# Patient Record
Sex: Female | Born: 1935
Health system: Southern US, Community
[De-identification: ages and names within clinical notes are randomized; demographics above are authoritative.]

## PROBLEM LIST (undated history)

## (undated) DIAGNOSIS — I10 Essential (primary) hypertension: Secondary | ICD-10-CM

## (undated) HISTORY — PX: ABDOMINAL HYSTERECTOMY: SHX81

---

## 2003-12-13 ENCOUNTER — Emergency Department (HOSPITAL_COMMUNITY): Admission: EM | Admit: 2003-12-13 | Discharge: 2003-12-13 | Payer: Self-pay | Admitting: *Deleted

## 2004-10-26 ENCOUNTER — Ambulatory Visit (HOSPITAL_COMMUNITY): Admission: RE | Admit: 2004-10-26 | Discharge: 2004-10-26 | Payer: Self-pay | Admitting: Internal Medicine

## 2014-06-06 ENCOUNTER — Emergency Department (HOSPITAL_COMMUNITY)
Admission: EM | Admit: 2014-06-06 | Discharge: 2014-06-06 | Disposition: A | Discharge status: Home / Self Care | Payer: Medicare HMO | Attending: Emergency Medicine | Admitting: Emergency Medicine

## 2014-06-06 ENCOUNTER — Emergency Department (HOSPITAL_COMMUNITY): Payer: Medicare HMO

## 2014-06-06 ENCOUNTER — Encounter (HOSPITAL_COMMUNITY): Payer: Self-pay | Admitting: Emergency Medicine

## 2014-06-06 DIAGNOSIS — M79609 Pain in unspecified limb: Secondary | ICD-10-CM | POA: Diagnosis present

## 2014-06-06 DIAGNOSIS — Z79899 Other long term (current) drug therapy: Secondary | ICD-10-CM | POA: Insufficient documentation

## 2014-06-06 DIAGNOSIS — R209 Unspecified disturbances of skin sensation: Secondary | ICD-10-CM | POA: Diagnosis not present

## 2014-06-06 DIAGNOSIS — Z7982 Long term (current) use of aspirin: Secondary | ICD-10-CM | POA: Diagnosis not present

## 2014-06-06 DIAGNOSIS — I10 Essential (primary) hypertension: Secondary | ICD-10-CM | POA: Diagnosis not present

## 2014-06-06 DIAGNOSIS — M1712 Unilateral primary osteoarthritis, left knee: Secondary | ICD-10-CM

## 2014-06-06 DIAGNOSIS — M1710 Unilateral primary osteoarthritis, unspecified knee: Secondary | ICD-10-CM | POA: Diagnosis not present

## 2014-06-06 HISTORY — DX: Essential (primary) hypertension: I10

## 2014-06-06 LAB — URINE MICROSCOPIC-ADD ON

## 2014-06-06 LAB — URINALYSIS, ROUTINE W REFLEX MICROSCOPIC
Bilirubin Urine: NEGATIVE
Glucose, UA: NEGATIVE mg/dL
Ketones, ur: NEGATIVE mg/dL
LEUKOCYTES UA: NEGATIVE
NITRITE: NEGATIVE
PH: 7.5 (ref 5.0–8.0)
Protein, ur: NEGATIVE mg/dL
SPECIFIC GRAVITY, URINE: 1.01 (ref 1.005–1.030)
UROBILINOGEN UA: 0.2 mg/dL (ref 0.0–1.0)

## 2014-06-06 MED ORDER — TRAMADOL HCL 50 MG PO TABS: 50.0000 mg | ORAL_TABLET | Freq: Four times a day (QID) | ORAL | Status: AC | PRN

## 2014-06-06 NOTE — ED Notes (Signed)
Patient with no complaints at this time. Respirations even and unlabored. Skin warm/dry. Discharge instructions reviewed with patient at this time. Patient given opportunity to voice concerns/ask questions. Patient discharged at this time and left Emergency Department with steady gait.   

## 2014-06-06 NOTE — ED Provider Notes (Signed)
CSN: [id number]     Arrival date & time 06/06/14  0930 History   First MD Initiated Contact with Patient 06/06/14 1013     Chief Complaint  Patient presents with  . Leg Pain     (Consider location/radiation/quality/duration/timing/severity/associated sxs/prior Treatment) The history is provided by the patient and a relative.   Jamie Green is a 80 y.o. female presenting with a one-week history of left leg pain along with intermittent episodes of numbness in her foot.  She denies prior episodes of similar symptoms and denies injuries, no history of low back pain or injury.  Her pain localizes behind her knee but radiates laterally into her calf.  She was seen by her PCP 2 days ago at which time she was placed on gabapentin and also advised to start taking a baby aspirin which she has done, no improvement in symptoms yet.  Her pain is constant and aching and worse with weightbearing.  She denies swelling, redness, rash.  Additionally she denies fevers or chills, abdominal pain or distention, shortness of breath or cough.    Past Medical History  Diagnosis Date  . Hypertension    Past Surgical History  Procedure Laterality Date  . Abdominal hysterectomy     No family history on file. History  Substance Use Topics  . Smoking status: Never Smoker   . Smokeless tobacco: Not on file  . Alcohol Use: No   OB History   Grav Para Term Preterm Abortions TAB SAB Ect Mult Living                 Review of Systems  Constitutional: Negative for fever and activity change.  HENT: Negative for congestion and sore throat.   Eyes: Negative.   Respiratory: Negative for cough, chest tightness and shortness of breath.   Cardiovascular: Negative for chest pain.  Gastrointestinal: Negative for nausea and abdominal pain.  Genitourinary: Negative.   Musculoskeletal: Positive for arthralgias. Negative for joint swelling and neck pain.  Skin: Negative.  Negative for rash and wound.  Neurological:  Positive for numbness. Negative for dizziness, weakness, light-headedness and headaches.  Psychiatric/Behavioral: Negative.       Allergies  Review of patient's allergies indicates no known allergies.  Home Medications   Prior to Admission medications   Medication Sig Start Date End Date Taking? Authorizing Provider  aspirin EC 81 MG tablet Take 81 mg by mouth daily.   Yes Historical Provider, MD  atenolol-chlorthalidone (TENORETIC) 50-25 MG per tablet Take 1 tablet by mouth daily.   Yes Historical Provider, MD  gabapentin (NEURONTIN) 100 MG capsule Take 100 mg by mouth 3 (three) times daily.   Yes Historical Provider, MD  traMADol (ULTRAM) 50 MG tablet Take 1 tablet (50 mg total) by mouth every 6 (six) hours as needed. 06/06/14   Burgess Amor, PA-C   BP 141/60  Pulse 57  Temp(Src) 97.5 F (36.4 C) (Oral)  Resp 17  Ht  (1.676 m)  Wt 130 lb (58.968 kg)  BMI 20.99 kg/m2  SpO2 100% Physical Exam  Constitutional: She appears well-developed and well-nourished.  HENT:  Head: Atraumatic.  Neck: Normal range of motion.  Cardiovascular:  Pulses equal bilaterally  Musculoskeletal: She exhibits no tenderness.       Left knee: She exhibits swelling.  Localized nontender edema in her left popliteal space when standing suggesting possible Baker's cyst.  She has several patches of superficial spider veins lateral left knee.  Small soft nontender varicosity left lateral  calf.  Is soft, nontender, no peripheral edema.  Pedal pulses are intact.  Lumbar spine is nontender.  Negative Homans sign.  Pain is not reproducible with palpation.  Neurological: She is alert. She has normal strength. She displays normal reflexes. No sensory deficit.  Skin: Skin is warm and dry. No rash noted.  Psychiatric: She has a normal mood and affect.    ED Course  Procedures (including critical care time) Labs Review Labs Reviewed  URINALYSIS, ROUTINE W REFLEX MICROSCOPIC - Abnormal; Notable for the  following:    Hgb urine dipstick TRACE (*)    All other components within normal limits  URINE MICROSCOPIC-ADD ON    Imaging Review Dg Lumbar Spine Complete  06/06/2014   CLINICAL DATA:  80 year old female with low back pain.  EXAM: LUMBAR SPINE - COMPLETE 4+ VIEW  COMPARISON:  None.  FINDINGS: Five non rib-bearing lumbar type vertebra are identified identified in normal alignment.  There is no evidence of acute fracture or subluxation.  Mild to moderate degenerative disc disease and spondylosis throughout the lumbar spine noted.  Moderate facet arthropathy in the lower lumbar spine is noted.  No focal bony lesions or spondylolysis identified.  IMPRESSION: No evidence of acute abnormality.  Mild to moderate multilevel degenerative changes.   Electronically Signed   By: Laveda Abbe M.D.   On: 06/06/2014 12:15   Dg Knee Complete 4 Views Left  06/06/2014   CLINICAL DATA:  Leg pain history knee pain.  EXAM: LEFT KNEE - COMPLETE 4+ VIEW  COMPARISON:  None.  FINDINGS: Decreased bony mineralization. Normal alignment. No acute or healing fracture or focal bony lesion. No significant joint space narrowing. Enthesopathic changes on the superior pole of the patella at the quadriceps tendon insertion site. Negative for pleural effusion or focal soft tissue swelling.  IMPRESSION: No acute bony abnormality or significant degenerative change for patient age.  Decreased bony mineralization.   Electronically Signed   By: Britta Mccreedy M.D.   On: 06/06/2014 12:15     EKG Interpretation None      MDM   Final diagnoses:  Primary osteoarthritis of left knee    Towards the end of ED visit, family members expressed concern over patient's  forgetfulness which has been present for the past several years but seems to be becoming more frequent and noticeable.  For example, yesterday she thought it was Sunday when it was really Saturday which is unusual for her.  Family is reluctant to discuss this issue with the patient  present as this conversation occurred while she was in x-ray.  They have mentioned this concern to the patient's PCP but to their knowledge she has not had formal testing for dementia or Alzheimer's which family states runs in the family.  Advised that they should discuss this concern further with the patient's PCP, may also need to consider a neurology referral  for further testing.  Will check UA to rule out uti as common source of confusion in the elderly.  Patients labs and/or radiological studies were viewed and considered during the medical decision making and disposition process. Mild arthritis on xrays, suspect this as source of sx. Pt was encouraged to take the medicines prescribed by her pcp 2 days ago.  Will given script for small quantity of tramadol, advised re sedation effects.  Heat tx.  F/u with pcp for recheck and further eval if sx persist.  Pt was discussed with Dr. Juleen China during this visit who also saw pt.  Burgess Amor, PA-C 06/06/14 1723

## 2014-06-06 NOTE — Discharge Instructions (Signed)
Arthritis, Nonspecific °Arthritis is inflammation of a joint. This usually means pain, redness, warmth or swelling are present. One or more joints may be involved. There are a number of types of arthritis. Your caregiver may not be able to tell what type of arthritis you have right away. °CAUSES  °The most common cause of arthritis is the wear and tear on the joint (osteoarthritis). This causes damage to the cartilage, which can break down over time. The knees, hips, back and neck are most often affected by this type of arthritis. °Other types of arthritis and common causes of joint pain include: °· Sprains and other injuries near the joint. Sometimes minor sprains and injuries cause pain and swelling that develop hours later. °· Rheumatoid arthritis. This affects hands, feet and knees. It usually affects both sides of your body at the same time. It is often associated with chronic ailments, fever, weight loss and general weakness. °· Crystal arthritis. Gout and pseudo gout can cause occasional acute severe pain, redness and swelling in the foot, ankle, or knee. °· Infectious arthritis. Bacteria can get into a joint through a break in overlying skin. This can cause infection of the joint. Bacteria and viruses can also spread through the blood and affect your joints. °· Drug, infectious and allergy reactions. Sometimes joints can become mildly painful and slightly swollen with these types of illnesses. °SYMPTOMS  °· Pain is the main symptom. °· Your joint or joints can also be red, swollen and warm or hot to the touch. °· You may have a fever with certain types of arthritis, or even feel overall ill. °· The joint with arthritis will hurt with movement. Stiffness is present with some types of arthritis. °DIAGNOSIS  °Your caregiver will suspect arthritis based on your description of your symptoms and on your exam. Testing may be needed to find the type of arthritis: °· Blood and sometimes urine tests. °· X-ray tests  and sometimes CT or MRI scans. °· Removal of fluid from the joint (arthrocentesis) is done to check for bacteria, crystals or other causes. Your caregiver (or a specialist) will numb the area over the joint with a local anesthetic, and use a needle to remove joint fluid for examination. This procedure is only minimally uncomfortable. °· Even with these tests, your caregiver may not be able to tell what kind of arthritis you have. Consultation with a specialist (rheumatologist) may be helpful. °TREATMENT  °Your caregiver will discuss with you treatment specific to your type of arthritis. If the specific type cannot be determined, then the following general recommendations may apply. °Treatment of severe joint pain includes: °· Rest. °· Elevation. °· Anti-inflammatory medication (for example, ibuprofen) may be prescribed. Avoiding activities that cause increased pain. °· Only take over-the-counter or prescription medicines for pain and discomfort as recommended by your caregiver. °· Cold packs over an inflamed joint may be used for 10 to 15 minutes every hour. Hot packs sometimes feel better, but do not use overnight. Do not use hot packs if you are diabetic without your caregiver's permission. °· A cortisone shot into arthritic joints may help reduce pain and swelling. °· Any acute arthritis that gets worse over the next 1 to 2 days needs to be looked at to be sure there is no joint infection. °Long-term arthritis treatment involves modifying activities and lifestyle to reduce joint stress jarring. This can include weight loss. Also, exercise is needed to nourish the joint cartilage and remove waste. This helps keep the muscles   around the joint strong. HOME CARE INSTRUCTIONS   Do not take aspirin to relieve pain if gout is suspected. This elevates uric acid levels.  Only take over-the-counter or prescription medicines for pain, discomfort or fever as directed by your caregiver.  Rest the joint as much as  possible.  If your joint is swollen, keep it elevated.  Use crutches if the painful joint is in your leg.  Drinking plenty of fluids may help for certain types of arthritis.  Follow your caregiver's dietary instructions.  Try low-impact exercise such as:  Swimming.  Water aerobics.  Biking.  Walking.  Morning stiffness is often relieved by a warm shower.  Put your joints through regular range-of-motion. SEEK MEDICAL CARE IF:   You do not feel better in 24 hours or are getting worse.  You have side effects to medications, or are not getting better with treatment. SEEK IMMEDIATE MEDICAL CARE IF:   You have a fever.  You develop severe joint pain, swelling or redness.  Many joints are involved and become painful and swollen.  There is severe back pain and/or leg weakness.  You have loss of bowel or bladder control. Document Released: 10/18/2004 Document Revised: 12/03/2011 Document Reviewed: 11/03/2008 [facility] Patient Information 2015 [place], [place]. This information is not intended to replace advice given to you by your health care provider. Make sure you discuss any questions you have with your health care provider.   You may use the medicine prescribed if needed for additional pain relief.  This medicine will make you drowsy so use caution taking this medicine.

## 2014-06-06 NOTE — ED Notes (Signed)
Pt states she has been having leg pain on the left side for about a week. Pain has been increasing since this weekend. Pt reports pain with ambulation. Pt has been taking daily medications as prescribes. Pt states she has not been taking any OTC medications.

## 2014-06-09 NOTE — ED Provider Notes (Signed)
Medical screening examination/treatment/procedure(s) were performed by non-physician practitioner and as supervising physician I was immediately available for consultation/collaboration.   EKG Interpretation None       Shelby Anderle, MD 06/09/14 1235 

## 2014-06-29 ENCOUNTER — Ambulatory Visit: Payer: Medicare HMO | Admitting: Orthopedic Surgery

## 2014-07-12 ENCOUNTER — Encounter: Payer: Medicare HMO | Admitting: Vascular Surgery

## 2014-07-12 ENCOUNTER — Encounter (HOSPITAL_COMMUNITY): Payer: Medicare HMO

## 2014-07-16 ENCOUNTER — Other Ambulatory Visit: Payer: Self-pay

## 2014-07-16 ENCOUNTER — Encounter: Payer: Self-pay | Admitting: Vascular Surgery

## 2014-07-16 DIAGNOSIS — M79605 Pain in left leg: Secondary | ICD-10-CM

## 2014-07-16 DIAGNOSIS — R202 Paresthesia of skin: Secondary | ICD-10-CM

## 2014-08-02 ENCOUNTER — Encounter: Payer: Self-pay | Admitting: Vascular Surgery

## 2014-08-03 ENCOUNTER — Inpatient Hospital Stay (HOSPITAL_COMMUNITY): Admission: RE | Admit: 2014-08-03 | Payer: Medicare HMO | Source: Ambulatory Visit

## 2014-08-03 ENCOUNTER — Encounter: Payer: Medicare HMO | Admitting: Vascular Surgery

## 2014-08-03 ENCOUNTER — Encounter (HOSPITAL_COMMUNITY): Payer: Medicare HMO

## 2014-11-01 ENCOUNTER — Ambulatory Visit: Payer: Medicare HMO | Admitting: Internal Medicine

## 2015-06-30 DIAGNOSIS — I83812 Varicose veins of left lower extremities with pain: Secondary | ICD-10-CM | POA: Diagnosis not present

## 2015-08-19 DIAGNOSIS — I10 Essential (primary) hypertension: Secondary | ICD-10-CM | POA: Diagnosis not present

## 2015-08-19 DIAGNOSIS — Z6821 Body mass index (BMI) 21.0-21.9, adult: Secondary | ICD-10-CM | POA: Diagnosis not present

## 2015-08-19 DIAGNOSIS — N183 Chronic kidney disease, stage 3 (moderate): Secondary | ICD-10-CM | POA: Diagnosis not present

## 2015-09-09 IMAGING — CR DG KNEE COMPLETE 4+V*L*
4 series · 4 of 4 positions shown · non-contrast
Comparison: None.

CLINICAL DATA: Leg pain history knee pain.

EXAM:
LEFT KNEE - COMPLETE 4+ VIEW

[view not recorded (1 of 4)]
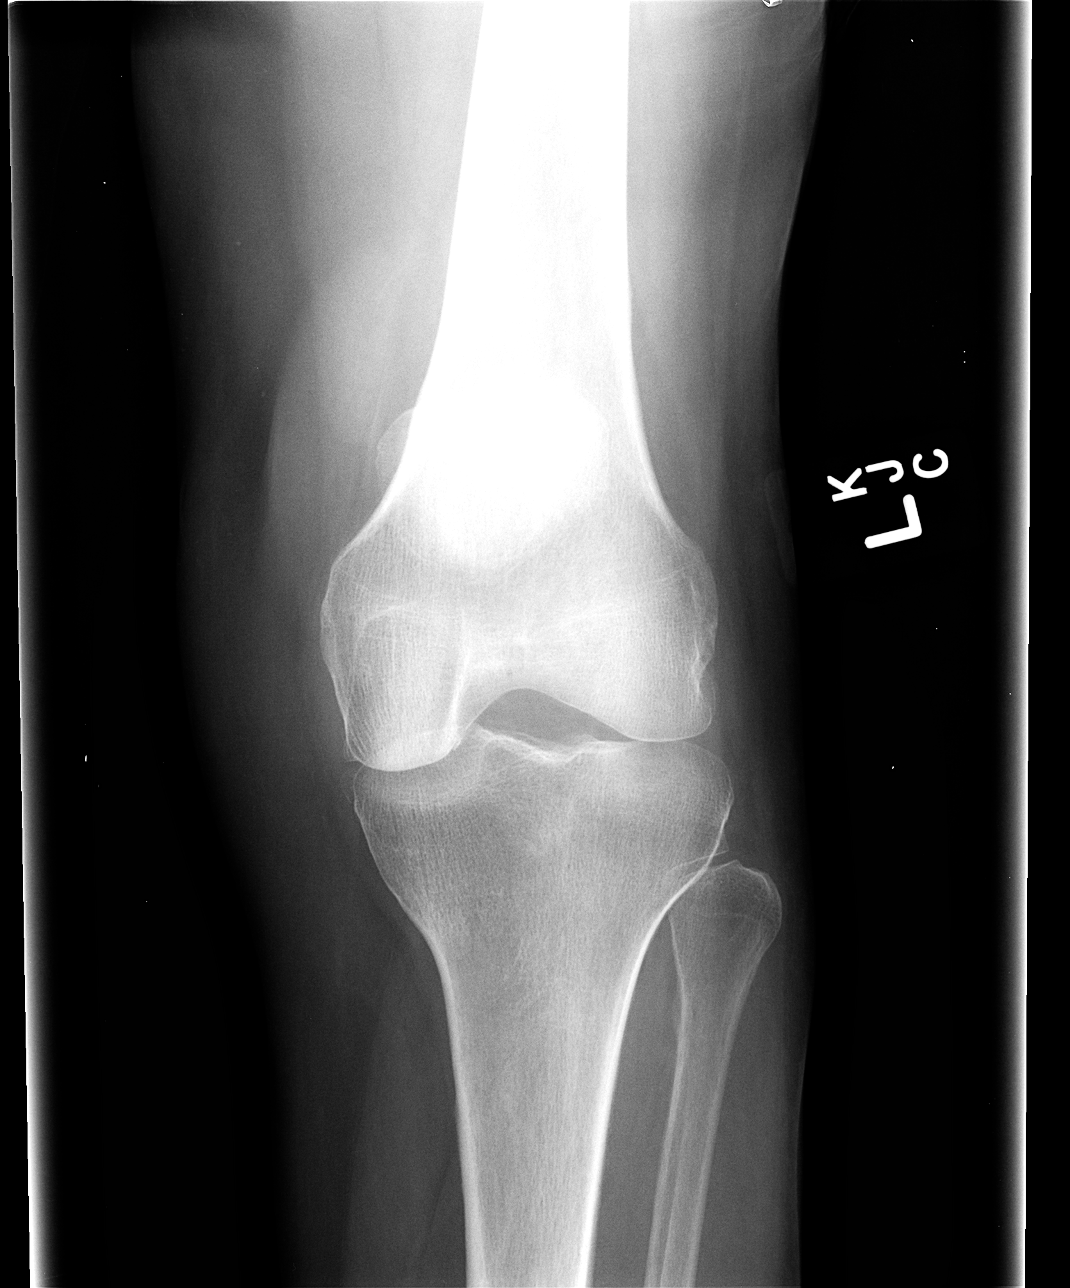

[view not recorded (2 of 4)]
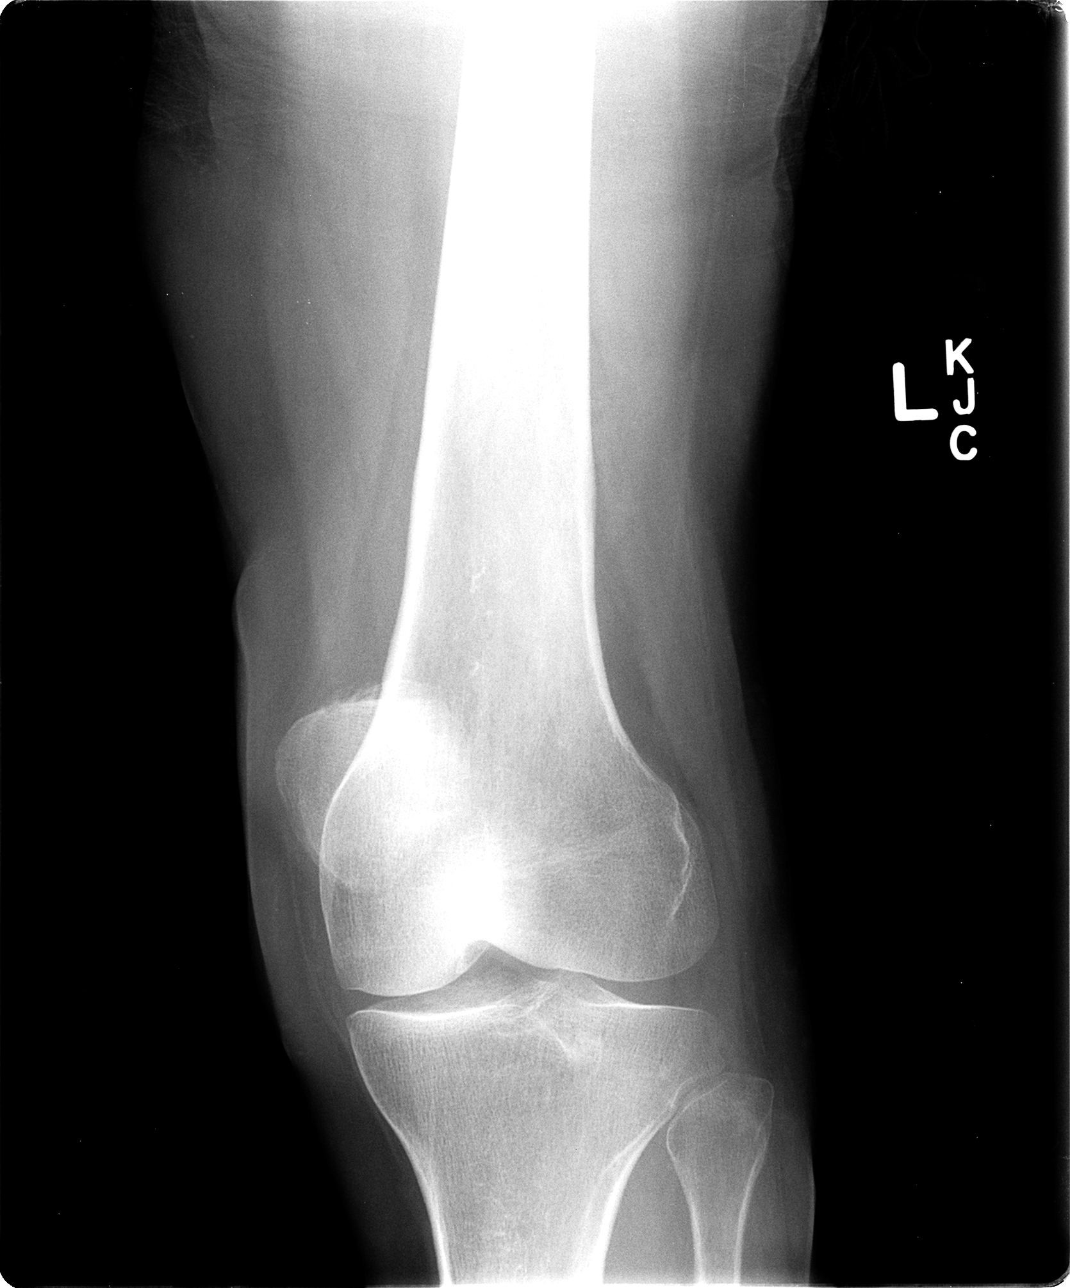

[view not recorded (3 of 4)]
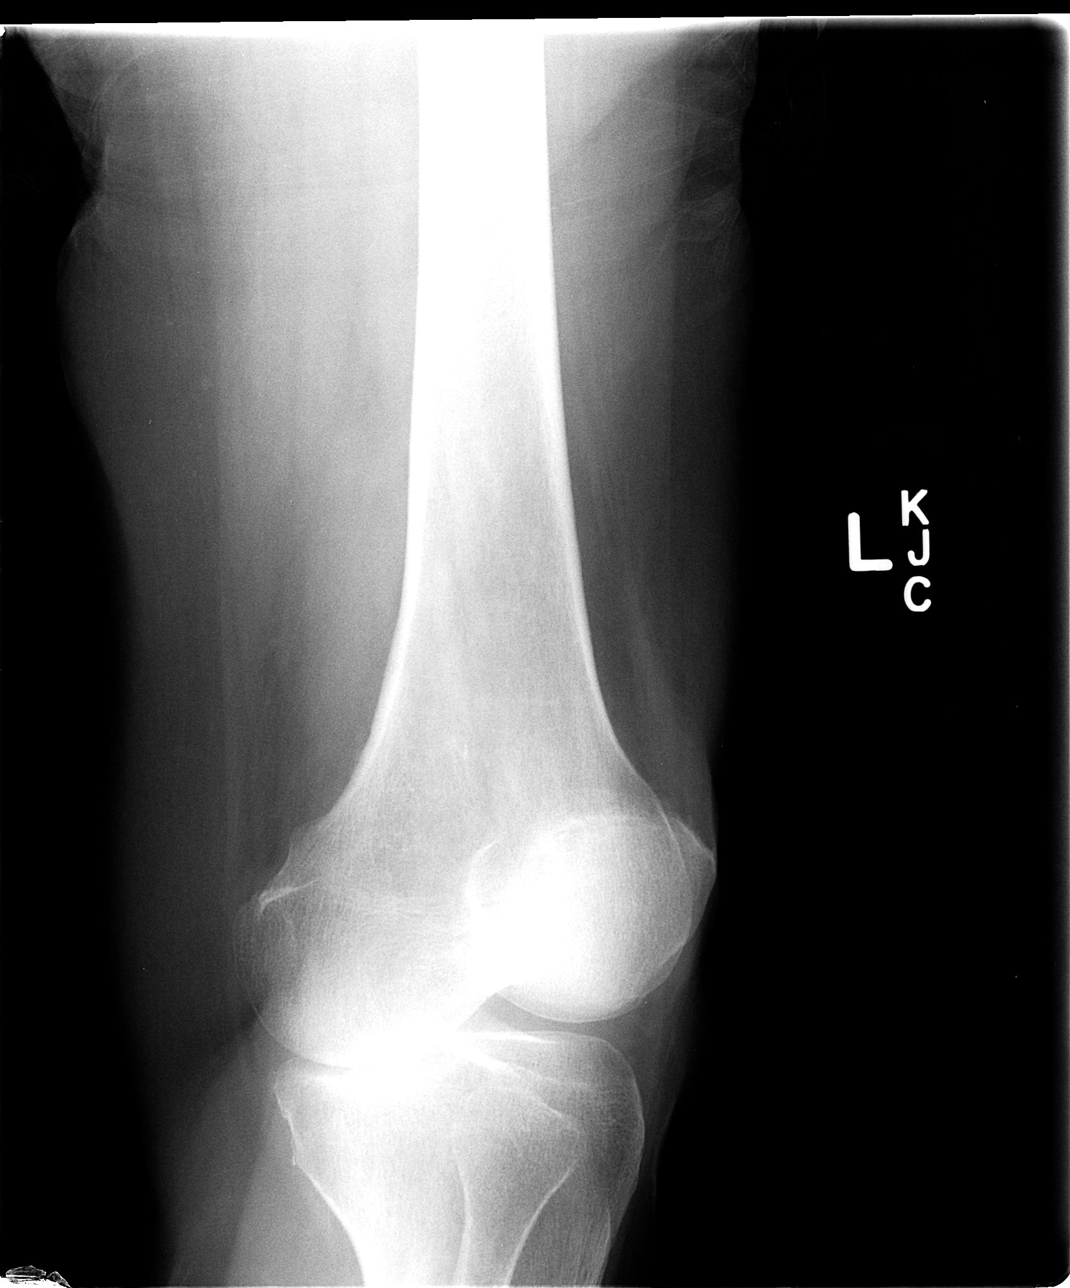

[view not recorded (4 of 4)]
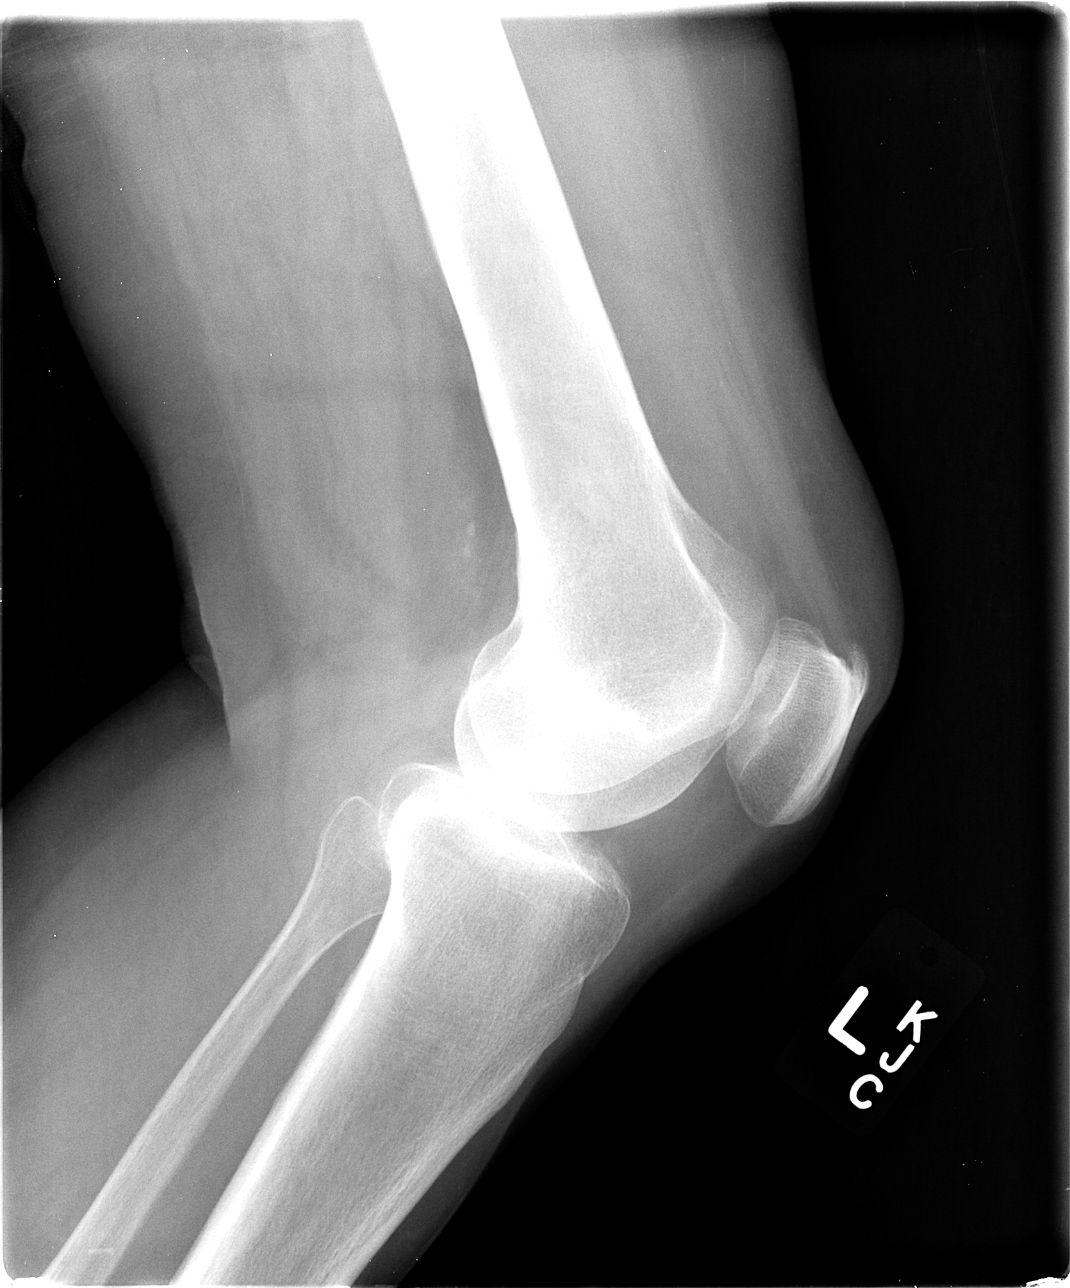

[4 of 4 positions shown; findings below may reference images not displayed]

FINDINGS: Decreased bony mineralization. Normal alignment. No acute or healing
fracture or focal bony lesion. No significant joint space narrowing.
Enthesopathic changes on the superior pole of the patella at the
quadriceps tendon insertion site. Negative for pleural effusion or
focal soft tissue swelling.
IMPRESSION: No acute bony abnormality or significant degenerative change for
patient age.

Decreased bony mineralization.

## 2015-11-14 DIAGNOSIS — I10 Essential (primary) hypertension: Secondary | ICD-10-CM | POA: Diagnosis not present

## 2015-11-14 DIAGNOSIS — Z6820 Body mass index (BMI) 20.0-20.9, adult: Secondary | ICD-10-CM | POA: Diagnosis not present

## 2015-11-14 DIAGNOSIS — N183 Chronic kidney disease, stage 3 (moderate): Secondary | ICD-10-CM | POA: Diagnosis not present

## 2015-11-14 DIAGNOSIS — N952 Postmenopausal atrophic vaginitis: Secondary | ICD-10-CM | POA: Diagnosis not present

## 2016-01-30 DIAGNOSIS — N183 Chronic kidney disease, stage 3 (moderate): Secondary | ICD-10-CM | POA: Diagnosis not present

## 2016-01-30 DIAGNOSIS — Z6821 Body mass index (BMI) 21.0-21.9, adult: Secondary | ICD-10-CM | POA: Diagnosis not present

## 2016-01-30 DIAGNOSIS — I160 Hypertensive urgency: Secondary | ICD-10-CM | POA: Diagnosis not present

## 2016-01-30 DIAGNOSIS — N3941 Urge incontinence: Secondary | ICD-10-CM | POA: Diagnosis not present

## 2016-01-30 DIAGNOSIS — I10 Essential (primary) hypertension: Secondary | ICD-10-CM | POA: Diagnosis not present

## 2016-04-04 DIAGNOSIS — M25562 Pain in left knee: Secondary | ICD-10-CM | POA: Diagnosis not present

## 2016-05-17 DIAGNOSIS — Z6821 Body mass index (BMI) 21.0-21.9, adult: Secondary | ICD-10-CM | POA: Diagnosis not present

## 2016-05-17 DIAGNOSIS — Z1389 Encounter for screening for other disorder: Secondary | ICD-10-CM | POA: Diagnosis not present

## 2016-05-17 DIAGNOSIS — Z0000 Encounter for general adult medical examination without abnormal findings: Secondary | ICD-10-CM | POA: Diagnosis not present

## 2016-05-17 DIAGNOSIS — I10 Essential (primary) hypertension: Secondary | ICD-10-CM | POA: Diagnosis not present

## 2016-06-21 DIAGNOSIS — Z1231 Encounter for screening mammogram for malignant neoplasm of breast: Secondary | ICD-10-CM | POA: Diagnosis not present

## 2016-08-20 DIAGNOSIS — I10 Essential (primary) hypertension: Secondary | ICD-10-CM | POA: Diagnosis not present

## 2016-08-20 DIAGNOSIS — Z6821 Body mass index (BMI) 21.0-21.9, adult: Secondary | ICD-10-CM | POA: Diagnosis not present

## 2016-08-20 DIAGNOSIS — E784 Other hyperlipidemia: Secondary | ICD-10-CM | POA: Diagnosis not present

## 2016-08-23 DIAGNOSIS — R69 Illness, unspecified: Secondary | ICD-10-CM | POA: Diagnosis not present

## 2016-12-17 DIAGNOSIS — Z6822 Body mass index (BMI) 22.0-22.9, adult: Secondary | ICD-10-CM | POA: Diagnosis not present

## 2016-12-17 DIAGNOSIS — I10 Essential (primary) hypertension: Secondary | ICD-10-CM | POA: Diagnosis not present

## 2016-12-17 DIAGNOSIS — Z0000 Encounter for general adult medical examination without abnormal findings: Secondary | ICD-10-CM | POA: Diagnosis not present

## 2017-03-11 DIAGNOSIS — E784 Other hyperlipidemia: Secondary | ICD-10-CM | POA: Diagnosis not present

## 2017-03-11 DIAGNOSIS — K5150 Left sided colitis without complications: Secondary | ICD-10-CM | POA: Diagnosis not present

## 2017-03-11 DIAGNOSIS — Z6821 Body mass index (BMI) 21.0-21.9, adult: Secondary | ICD-10-CM | POA: Diagnosis not present

## 2017-03-11 DIAGNOSIS — I10 Essential (primary) hypertension: Secondary | ICD-10-CM | POA: Diagnosis not present

## 2017-03-13 DIAGNOSIS — Z79899 Other long term (current) drug therapy: Secondary | ICD-10-CM | POA: Diagnosis not present

## 2017-03-13 DIAGNOSIS — I10 Essential (primary) hypertension: Secondary | ICD-10-CM | POA: Diagnosis not present

## 2017-03-13 DIAGNOSIS — E784 Other hyperlipidemia: Secondary | ICD-10-CM | POA: Diagnosis not present

## 2017-06-13 DIAGNOSIS — Z131 Encounter for screening for diabetes mellitus: Secondary | ICD-10-CM | POA: Diagnosis not present

## 2017-06-13 DIAGNOSIS — Z0000 Encounter for general adult medical examination without abnormal findings: Secondary | ICD-10-CM | POA: Diagnosis not present

## 2017-06-13 DIAGNOSIS — E784 Other hyperlipidemia: Secondary | ICD-10-CM | POA: Diagnosis not present

## 2017-06-13 DIAGNOSIS — I10 Essential (primary) hypertension: Secondary | ICD-10-CM | POA: Diagnosis not present

## 2017-06-13 DIAGNOSIS — Z6821 Body mass index (BMI) 21.0-21.9, adult: Secondary | ICD-10-CM | POA: Diagnosis not present

## 2017-06-13 DIAGNOSIS — Z1389 Encounter for screening for other disorder: Secondary | ICD-10-CM | POA: Diagnosis not present

## 2017-07-02 DIAGNOSIS — H35033 Hypertensive retinopathy, bilateral: Secondary | ICD-10-CM | POA: Diagnosis not present

## 2017-11-04 DIAGNOSIS — Z79899 Other long term (current) drug therapy: Secondary | ICD-10-CM | POA: Diagnosis not present

## 2017-11-04 DIAGNOSIS — G3184 Mild cognitive impairment, so stated: Secondary | ICD-10-CM | POA: Diagnosis not present

## 2017-11-04 DIAGNOSIS — I10 Essential (primary) hypertension: Secondary | ICD-10-CM | POA: Diagnosis not present

## 2017-11-04 DIAGNOSIS — Z6820 Body mass index (BMI) 20.0-20.9, adult: Secondary | ICD-10-CM | POA: Diagnosis not present

## 2017-11-04 DIAGNOSIS — E7849 Other hyperlipidemia: Secondary | ICD-10-CM | POA: Diagnosis not present

## 2017-11-04 DIAGNOSIS — Z1389 Encounter for screening for other disorder: Secondary | ICD-10-CM | POA: Diagnosis not present

## 2017-11-04 DIAGNOSIS — Z6821 Body mass index (BMI) 21.0-21.9, adult: Secondary | ICD-10-CM | POA: Diagnosis not present

## 2018-02-04 DIAGNOSIS — I10 Essential (primary) hypertension: Secondary | ICD-10-CM | POA: Diagnosis not present

## 2018-02-04 DIAGNOSIS — Z6820 Body mass index (BMI) 20.0-20.9, adult: Secondary | ICD-10-CM | POA: Diagnosis not present

## 2018-02-04 DIAGNOSIS — J0111 Acute recurrent frontal sinusitis: Secondary | ICD-10-CM | POA: Diagnosis not present

## 2018-04-14 DIAGNOSIS — R197 Diarrhea, unspecified: Secondary | ICD-10-CM | POA: Diagnosis not present

## 2018-05-07 DIAGNOSIS — E7849 Other hyperlipidemia: Secondary | ICD-10-CM | POA: Diagnosis not present

## 2018-05-07 DIAGNOSIS — Z6821 Body mass index (BMI) 21.0-21.9, adult: Secondary | ICD-10-CM | POA: Diagnosis not present

## 2018-05-07 DIAGNOSIS — N182 Chronic kidney disease, stage 2 (mild): Secondary | ICD-10-CM | POA: Diagnosis not present

## 2018-05-07 DIAGNOSIS — J0111 Acute recurrent frontal sinusitis: Secondary | ICD-10-CM | POA: Diagnosis not present

## 2018-05-07 DIAGNOSIS — I10 Essential (primary) hypertension: Secondary | ICD-10-CM | POA: Diagnosis not present

## 2018-05-07 DIAGNOSIS — Z6820 Body mass index (BMI) 20.0-20.9, adult: Secondary | ICD-10-CM | POA: Diagnosis not present

## 2018-07-23 DIAGNOSIS — N182 Chronic kidney disease, stage 2 (mild): Secondary | ICD-10-CM | POA: Diagnosis not present

## 2018-07-23 DIAGNOSIS — E7849 Other hyperlipidemia: Secondary | ICD-10-CM | POA: Diagnosis not present

## 2018-07-23 DIAGNOSIS — Z6821 Body mass index (BMI) 21.0-21.9, adult: Secondary | ICD-10-CM | POA: Diagnosis not present

## 2018-07-23 DIAGNOSIS — K210 Gastro-esophageal reflux disease with esophagitis: Secondary | ICD-10-CM | POA: Diagnosis not present

## 2018-07-23 DIAGNOSIS — M7061 Trochanteric bursitis, right hip: Secondary | ICD-10-CM | POA: Diagnosis not present

## 2018-07-23 DIAGNOSIS — I10 Essential (primary) hypertension: Secondary | ICD-10-CM | POA: Diagnosis not present

## 2018-11-20 DIAGNOSIS — Z6820 Body mass index (BMI) 20.0-20.9, adult: Secondary | ICD-10-CM | POA: Diagnosis not present

## 2018-11-20 DIAGNOSIS — N182 Chronic kidney disease, stage 2 (mild): Secondary | ICD-10-CM | POA: Diagnosis not present

## 2018-11-20 DIAGNOSIS — Z1389 Encounter for screening for other disorder: Secondary | ICD-10-CM | POA: Diagnosis not present

## 2018-11-20 DIAGNOSIS — E785 Hyperlipidemia, unspecified: Secondary | ICD-10-CM | POA: Diagnosis not present

## 2018-11-20 DIAGNOSIS — E7849 Other hyperlipidemia: Secondary | ICD-10-CM | POA: Diagnosis not present

## 2018-11-20 DIAGNOSIS — K210 Gastro-esophageal reflux disease with esophagitis: Secondary | ICD-10-CM | POA: Diagnosis not present

## 2018-11-20 DIAGNOSIS — Z0000 Encounter for general adult medical examination without abnormal findings: Secondary | ICD-10-CM | POA: Diagnosis not present

## 2018-11-20 DIAGNOSIS — I10 Essential (primary) hypertension: Secondary | ICD-10-CM | POA: Diagnosis not present

## 2018-12-18 DIAGNOSIS — Z6820 Body mass index (BMI) 20.0-20.9, adult: Secondary | ICD-10-CM | POA: Diagnosis not present

## 2018-12-18 DIAGNOSIS — H6121 Impacted cerumen, right ear: Secondary | ICD-10-CM | POA: Diagnosis not present

## 2019-02-24 DIAGNOSIS — K210 Gastro-esophageal reflux disease with esophagitis: Secondary | ICD-10-CM | POA: Diagnosis not present

## 2019-02-24 DIAGNOSIS — Z6820 Body mass index (BMI) 20.0-20.9, adult: Secondary | ICD-10-CM | POA: Diagnosis not present

## 2019-02-24 DIAGNOSIS — E785 Hyperlipidemia, unspecified: Secondary | ICD-10-CM | POA: Diagnosis not present

## 2019-02-24 DIAGNOSIS — I10 Essential (primary) hypertension: Secondary | ICD-10-CM | POA: Diagnosis not present

## 2019-05-12 DIAGNOSIS — Z6820 Body mass index (BMI) 20.0-20.9, adult: Secondary | ICD-10-CM | POA: Diagnosis not present

## 2019-05-12 DIAGNOSIS — K210 Gastro-esophageal reflux disease with esophagitis: Secondary | ICD-10-CM | POA: Diagnosis not present

## 2019-05-12 DIAGNOSIS — I10 Essential (primary) hypertension: Secondary | ICD-10-CM | POA: Diagnosis not present

## 2019-05-12 DIAGNOSIS — E785 Hyperlipidemia, unspecified: Secondary | ICD-10-CM | POA: Diagnosis not present

## 2019-05-12 DIAGNOSIS — M545 Low back pain: Secondary | ICD-10-CM | POA: Diagnosis not present

## 2019-05-19 DIAGNOSIS — M79605 Pain in left leg: Secondary | ICD-10-CM | POA: Diagnosis not present

## 2019-05-19 DIAGNOSIS — I83812 Varicose veins of left lower extremities with pain: Secondary | ICD-10-CM | POA: Diagnosis not present

## 2019-05-19 DIAGNOSIS — I8312 Varicose veins of left lower extremity with inflammation: Secondary | ICD-10-CM | POA: Diagnosis not present

## 2019-06-09 DIAGNOSIS — M79605 Pain in left leg: Secondary | ICD-10-CM | POA: Diagnosis not present

## 2019-06-09 DIAGNOSIS — I83812 Varicose veins of left lower extremities with pain: Secondary | ICD-10-CM | POA: Diagnosis not present

## 2019-06-09 DIAGNOSIS — I8312 Varicose veins of left lower extremity with inflammation: Secondary | ICD-10-CM | POA: Diagnosis not present

## 2019-06-11 DIAGNOSIS — N183 Chronic kidney disease, stage 3 (moderate): Secondary | ICD-10-CM | POA: Diagnosis not present

## 2019-06-11 DIAGNOSIS — K210 Gastro-esophageal reflux disease with esophagitis: Secondary | ICD-10-CM | POA: Diagnosis not present

## 2019-06-11 DIAGNOSIS — M545 Low back pain: Secondary | ICD-10-CM | POA: Diagnosis not present

## 2019-06-11 DIAGNOSIS — Z6820 Body mass index (BMI) 20.0-20.9, adult: Secondary | ICD-10-CM | POA: Diagnosis not present

## 2019-06-11 DIAGNOSIS — E782 Mixed hyperlipidemia: Secondary | ICD-10-CM | POA: Diagnosis not present

## 2019-06-11 DIAGNOSIS — I10 Essential (primary) hypertension: Secondary | ICD-10-CM | POA: Diagnosis not present

## 2019-09-15 DIAGNOSIS — N1831 Chronic kidney disease, stage 3a: Secondary | ICD-10-CM | POA: Diagnosis not present

## 2019-09-15 DIAGNOSIS — E782 Mixed hyperlipidemia: Secondary | ICD-10-CM | POA: Diagnosis not present

## 2019-09-15 DIAGNOSIS — K2100 Gastro-esophageal reflux disease with esophagitis, without bleeding: Secondary | ICD-10-CM | POA: Diagnosis not present

## 2019-09-15 DIAGNOSIS — N183 Chronic kidney disease, stage 3 unspecified: Secondary | ICD-10-CM | POA: Diagnosis not present

## 2019-09-15 DIAGNOSIS — I10 Essential (primary) hypertension: Secondary | ICD-10-CM | POA: Diagnosis not present

## 2019-09-15 DIAGNOSIS — Z6820 Body mass index (BMI) 20.0-20.9, adult: Secondary | ICD-10-CM | POA: Diagnosis not present

## 2019-09-15 DIAGNOSIS — M545 Low back pain: Secondary | ICD-10-CM | POA: Diagnosis not present

## 2019-10-05 ENCOUNTER — Ambulatory Visit: Payer: Medicare HMO | Attending: Internal Medicine

## 2019-10-05 ENCOUNTER — Other Ambulatory Visit: Payer: Self-pay

## 2019-10-05 DIAGNOSIS — Z20822 Contact with and (suspected) exposure to covid-19: Secondary | ICD-10-CM

## 2019-10-07 LAB — NOVEL CORONAVIRUS, NAA: SARS-CoV-2, NAA: DETECTED — AB

## 2019-10-09 ENCOUNTER — Telehealth: Payer: Self-pay | Admitting: Nurse Practitioner

## 2019-10-09 NOTE — Telephone Encounter (Signed)
Called to Discuss with patient about Covid symptoms and the use of bamlanivimab, a monoclonal antibody infusion for those with mild to moderate Covid symptoms and at a high risk of hospitalization.     Pt is qualified for this infusion at the [facility] due to co-morbid conditions and/or a member of an at-risk group.     Unable to reach pt  

## 2019-10-14 ENCOUNTER — Telehealth: Payer: Self-pay | Admitting: *Deleted

## 2019-10-14 NOTE — Telephone Encounter (Signed)
Pt's daughter (pt there) called to get the covid 19 test result for her mom. She is advised that she was positive 9 days ago.Marland Kitchen Her daughter stated that she has not been out any where. Her symptoms have been feeling tried. Advised her to notify her pcp regarding her results. And to call 911 for any respiratory problems, She voiced understanding.

## 2019-10-27 DIAGNOSIS — H16041 Marginal corneal ulcer, right eye: Secondary | ICD-10-CM | POA: Diagnosis not present

## 2019-11-03 DIAGNOSIS — H16041 Marginal corneal ulcer, right eye: Secondary | ICD-10-CM | POA: Diagnosis not present

## 2019-11-04 DIAGNOSIS — H1031 Unspecified acute conjunctivitis, right eye: Secondary | ICD-10-CM | POA: Diagnosis not present

## 2019-11-04 DIAGNOSIS — Z681 Body mass index (BMI) 19 or less, adult: Secondary | ICD-10-CM | POA: Diagnosis not present

## 2019-11-05 DIAGNOSIS — Z79899 Other long term (current) drug therapy: Secondary | ICD-10-CM | POA: Diagnosis not present

## 2019-11-05 DIAGNOSIS — H16001 Unspecified corneal ulcer, right eye: Secondary | ICD-10-CM | POA: Diagnosis not present

## 2019-11-09 DIAGNOSIS — H16001 Unspecified corneal ulcer, right eye: Secondary | ICD-10-CM | POA: Diagnosis not present

## 2019-11-13 DIAGNOSIS — Z79899 Other long term (current) drug therapy: Secondary | ICD-10-CM | POA: Diagnosis not present

## 2019-11-13 DIAGNOSIS — H16001 Unspecified corneal ulcer, right eye: Secondary | ICD-10-CM | POA: Diagnosis not present

## 2019-11-20 DIAGNOSIS — H16001 Unspecified corneal ulcer, right eye: Secondary | ICD-10-CM | POA: Diagnosis not present

## 2019-12-14 DIAGNOSIS — H16001 Unspecified corneal ulcer, right eye: Secondary | ICD-10-CM | POA: Diagnosis not present

## 2019-12-17 DIAGNOSIS — E782 Mixed hyperlipidemia: Secondary | ICD-10-CM | POA: Diagnosis not present

## 2019-12-17 DIAGNOSIS — K2100 Gastro-esophageal reflux disease with esophagitis, without bleeding: Secondary | ICD-10-CM | POA: Diagnosis not present

## 2019-12-17 DIAGNOSIS — Z681 Body mass index (BMI) 19 or less, adult: Secondary | ICD-10-CM | POA: Diagnosis not present

## 2019-12-17 DIAGNOSIS — N1831 Chronic kidney disease, stage 3a: Secondary | ICD-10-CM | POA: Diagnosis not present

## 2019-12-17 DIAGNOSIS — I10 Essential (primary) hypertension: Secondary | ICD-10-CM | POA: Diagnosis not present

## 2019-12-17 DIAGNOSIS — M545 Low back pain: Secondary | ICD-10-CM | POA: Diagnosis not present

## 2020-01-11 DIAGNOSIS — H40039 Anatomical narrow angle, unspecified eye: Secondary | ICD-10-CM | POA: Diagnosis not present

## 2020-01-11 DIAGNOSIS — H25813 Combined forms of age-related cataract, bilateral: Secondary | ICD-10-CM | POA: Diagnosis not present

## 2020-01-11 DIAGNOSIS — H16231 Neurotrophic keratoconjunctivitis, right eye: Secondary | ICD-10-CM | POA: Diagnosis not present

## 2020-01-11 DIAGNOSIS — H40033 Anatomical narrow angle, bilateral: Secondary | ICD-10-CM | POA: Diagnosis not present

## 2020-01-11 DIAGNOSIS — H179 Unspecified corneal scar and opacity: Secondary | ICD-10-CM | POA: Diagnosis not present

## 2020-03-11 DIAGNOSIS — Z0100 Encounter for examination of eyes and vision without abnormal findings: Secondary | ICD-10-CM | POA: Diagnosis not present

## 2020-04-07 DIAGNOSIS — K2100 Gastro-esophageal reflux disease with esophagitis, without bleeding: Secondary | ICD-10-CM | POA: Diagnosis not present

## 2020-04-07 DIAGNOSIS — M545 Low back pain: Secondary | ICD-10-CM | POA: Diagnosis not present

## 2020-04-07 DIAGNOSIS — E782 Mixed hyperlipidemia: Secondary | ICD-10-CM | POA: Diagnosis not present

## 2020-04-07 DIAGNOSIS — Z681 Body mass index (BMI) 19 or less, adult: Secondary | ICD-10-CM | POA: Diagnosis not present

## 2020-04-07 DIAGNOSIS — I10 Essential (primary) hypertension: Secondary | ICD-10-CM | POA: Diagnosis not present

## 2020-04-07 DIAGNOSIS — N1831 Chronic kidney disease, stage 3a: Secondary | ICD-10-CM | POA: Diagnosis not present

## 2020-05-20 DIAGNOSIS — H00014 Hordeolum externum left upper eyelid: Secondary | ICD-10-CM | POA: Diagnosis not present

## 2020-05-23 DIAGNOSIS — H16231 Neurotrophic keratoconjunctivitis, right eye: Secondary | ICD-10-CM | POA: Diagnosis not present

## 2020-05-23 DIAGNOSIS — H179 Unspecified corneal scar and opacity: Secondary | ICD-10-CM | POA: Diagnosis not present

## 2020-05-23 DIAGNOSIS — B0052 Herpesviral keratitis: Secondary | ICD-10-CM | POA: Diagnosis not present

## 2020-05-23 DIAGNOSIS — H2513 Age-related nuclear cataract, bilateral: Secondary | ICD-10-CM | POA: Diagnosis not present

## 2020-05-23 DIAGNOSIS — H40033 Anatomical narrow angle, bilateral: Secondary | ICD-10-CM | POA: Diagnosis not present

## 2020-05-23 DIAGNOSIS — B0059 Other herpesviral disease of eye: Secondary | ICD-10-CM | POA: Diagnosis not present

## 2020-06-06 DIAGNOSIS — H2511 Age-related nuclear cataract, right eye: Secondary | ICD-10-CM | POA: Diagnosis not present

## 2020-06-06 DIAGNOSIS — B0059 Other herpesviral disease of eye: Secondary | ICD-10-CM | POA: Diagnosis not present

## 2020-06-06 DIAGNOSIS — H25812 Combined forms of age-related cataract, left eye: Secondary | ICD-10-CM | POA: Diagnosis not present

## 2020-06-06 DIAGNOSIS — H40033 Anatomical narrow angle, bilateral: Secondary | ICD-10-CM | POA: Diagnosis not present

## 2020-06-27 DIAGNOSIS — B0059 Other herpesviral disease of eye: Secondary | ICD-10-CM | POA: Diagnosis not present

## 2020-06-27 DIAGNOSIS — H2513 Age-related nuclear cataract, bilateral: Secondary | ICD-10-CM | POA: Diagnosis not present

## 2020-06-27 DIAGNOSIS — H25812 Combined forms of age-related cataract, left eye: Secondary | ICD-10-CM | POA: Diagnosis not present

## 2020-06-27 DIAGNOSIS — H4020X0 Unspecified primary angle-closure glaucoma, stage unspecified: Secondary | ICD-10-CM | POA: Diagnosis not present

## 2020-06-27 DIAGNOSIS — H179 Unspecified corneal scar and opacity: Secondary | ICD-10-CM | POA: Diagnosis not present

## 2020-07-12 DIAGNOSIS — I10 Essential (primary) hypertension: Secondary | ICD-10-CM | POA: Diagnosis not present

## 2020-07-12 DIAGNOSIS — Z6820 Body mass index (BMI) 20.0-20.9, adult: Secondary | ICD-10-CM | POA: Diagnosis not present

## 2020-07-12 DIAGNOSIS — E782 Mixed hyperlipidemia: Secondary | ICD-10-CM | POA: Diagnosis not present

## 2020-07-12 DIAGNOSIS — K2100 Gastro-esophageal reflux disease with esophagitis, without bleeding: Secondary | ICD-10-CM | POA: Diagnosis not present

## 2020-07-12 DIAGNOSIS — M545 Low back pain, unspecified: Secondary | ICD-10-CM | POA: Diagnosis not present

## 2020-07-12 DIAGNOSIS — Z0000 Encounter for general adult medical examination without abnormal findings: Secondary | ICD-10-CM | POA: Diagnosis not present

## 2020-07-12 DIAGNOSIS — N1831 Chronic kidney disease, stage 3a: Secondary | ICD-10-CM | POA: Diagnosis not present

## 2020-07-12 DIAGNOSIS — Z1331 Encounter for screening for depression: Secondary | ICD-10-CM | POA: Diagnosis not present

## 2020-08-15 DIAGNOSIS — H2511 Age-related nuclear cataract, right eye: Secondary | ICD-10-CM | POA: Diagnosis not present

## 2020-08-15 DIAGNOSIS — H1789 Other corneal scars and opacities: Secondary | ICD-10-CM | POA: Diagnosis not present

## 2020-08-15 DIAGNOSIS — H40033 Anatomical narrow angle, bilateral: Secondary | ICD-10-CM | POA: Diagnosis not present

## 2020-08-15 DIAGNOSIS — H2513 Age-related nuclear cataract, bilateral: Secondary | ICD-10-CM | POA: Diagnosis not present

## 2020-08-15 DIAGNOSIS — H25812 Combined forms of age-related cataract, left eye: Secondary | ICD-10-CM | POA: Diagnosis not present

## 2020-08-15 DIAGNOSIS — H16231 Neurotrophic keratoconjunctivitis, right eye: Secondary | ICD-10-CM | POA: Diagnosis not present

## 2020-08-15 DIAGNOSIS — B0059 Other herpesviral disease of eye: Secondary | ICD-10-CM | POA: Diagnosis not present

## 2020-11-08 DIAGNOSIS — I10 Essential (primary) hypertension: Secondary | ICD-10-CM | POA: Diagnosis not present

## 2020-11-08 DIAGNOSIS — Z6821 Body mass index (BMI) 21.0-21.9, adult: Secondary | ICD-10-CM | POA: Diagnosis not present

## 2020-11-08 DIAGNOSIS — E782 Mixed hyperlipidemia: Secondary | ICD-10-CM | POA: Diagnosis not present

## 2020-11-08 DIAGNOSIS — K2100 Gastro-esophageal reflux disease with esophagitis, without bleeding: Secondary | ICD-10-CM | POA: Diagnosis not present

## 2020-11-08 DIAGNOSIS — N1831 Chronic kidney disease, stage 3a: Secondary | ICD-10-CM | POA: Diagnosis not present

## 2020-11-08 DIAGNOSIS — M545 Low back pain, unspecified: Secondary | ICD-10-CM | POA: Diagnosis not present

## 2020-11-14 DIAGNOSIS — B0059 Other herpesviral disease of eye: Secondary | ICD-10-CM | POA: Diagnosis not present

## 2020-11-14 DIAGNOSIS — H2513 Age-related nuclear cataract, bilateral: Secondary | ICD-10-CM | POA: Diagnosis not present

## 2020-11-14 DIAGNOSIS — H25813 Combined forms of age-related cataract, bilateral: Secondary | ICD-10-CM | POA: Diagnosis not present

## 2020-11-14 DIAGNOSIS — H40033 Anatomical narrow angle, bilateral: Secondary | ICD-10-CM | POA: Diagnosis not present

## 2020-11-14 DIAGNOSIS — H179 Unspecified corneal scar and opacity: Secondary | ICD-10-CM | POA: Diagnosis not present

## 2020-11-14 DIAGNOSIS — H16231 Neurotrophic keratoconjunctivitis, right eye: Secondary | ICD-10-CM | POA: Diagnosis not present

## 2021-01-19 DIAGNOSIS — M810 Age-related osteoporosis without current pathological fracture: Secondary | ICD-10-CM | POA: Diagnosis not present

## 2021-01-19 DIAGNOSIS — Z780 Asymptomatic menopausal state: Secondary | ICD-10-CM | POA: Diagnosis not present

## 2021-02-07 DIAGNOSIS — N1831 Chronic kidney disease, stage 3a: Secondary | ICD-10-CM | POA: Diagnosis not present

## 2021-02-07 DIAGNOSIS — M545 Low back pain, unspecified: Secondary | ICD-10-CM | POA: Diagnosis not present

## 2021-02-07 DIAGNOSIS — K2100 Gastro-esophageal reflux disease with esophagitis, without bleeding: Secondary | ICD-10-CM | POA: Diagnosis not present

## 2021-02-07 DIAGNOSIS — E782 Mixed hyperlipidemia: Secondary | ICD-10-CM | POA: Diagnosis not present

## 2021-02-07 DIAGNOSIS — I10 Essential (primary) hypertension: Secondary | ICD-10-CM | POA: Diagnosis not present

## 2021-02-07 DIAGNOSIS — Z6821 Body mass index (BMI) 21.0-21.9, adult: Secondary | ICD-10-CM | POA: Diagnosis not present

## 2021-02-08 DIAGNOSIS — H2513 Age-related nuclear cataract, bilateral: Secondary | ICD-10-CM | POA: Diagnosis not present

## 2021-02-08 DIAGNOSIS — H04123 Dry eye syndrome of bilateral lacrimal glands: Secondary | ICD-10-CM | POA: Diagnosis not present

## 2021-02-08 DIAGNOSIS — H04121 Dry eye syndrome of right lacrimal gland: Secondary | ICD-10-CM | POA: Diagnosis not present

## 2021-02-08 DIAGNOSIS — H16043 Marginal corneal ulcer, bilateral: Secondary | ICD-10-CM | POA: Diagnosis not present

## 2021-02-08 DIAGNOSIS — H04122 Dry eye syndrome of left lacrimal gland: Secondary | ICD-10-CM | POA: Diagnosis not present

## 2021-02-15 DIAGNOSIS — H04123 Dry eye syndrome of bilateral lacrimal glands: Secondary | ICD-10-CM | POA: Diagnosis not present

## 2021-02-15 DIAGNOSIS — H16043 Marginal corneal ulcer, bilateral: Secondary | ICD-10-CM | POA: Diagnosis not present

## 2021-02-15 DIAGNOSIS — H2513 Age-related nuclear cataract, bilateral: Secondary | ICD-10-CM | POA: Diagnosis not present

## 2021-02-28 DIAGNOSIS — H16009 Unspecified corneal ulcer, unspecified eye: Secondary | ICD-10-CM | POA: Diagnosis not present

## 2021-02-28 DIAGNOSIS — H16041 Marginal corneal ulcer, right eye: Secondary | ICD-10-CM | POA: Diagnosis not present

## 2021-04-10 DIAGNOSIS — E049 Nontoxic goiter, unspecified: Secondary | ICD-10-CM | POA: Diagnosis not present

## 2021-04-10 DIAGNOSIS — Z6821 Body mass index (BMI) 21.0-21.9, adult: Secondary | ICD-10-CM | POA: Diagnosis not present

## 2021-04-13 DIAGNOSIS — E041 Nontoxic single thyroid nodule: Secondary | ICD-10-CM | POA: Diagnosis not present

## 2021-04-20 ENCOUNTER — Other Ambulatory Visit (HOSPITAL_COMMUNITY): Payer: Self-pay | Admitting: Internal Medicine

## 2021-04-20 DIAGNOSIS — E041 Nontoxic single thyroid nodule: Secondary | ICD-10-CM | POA: Diagnosis not present

## 2021-04-26 ENCOUNTER — Ambulatory Visit (HOSPITAL_COMMUNITY)
Admission: RE | Admit: 2021-04-26 | Discharge: 2021-04-26 | Disposition: A | Discharge status: Home / Self Care | Payer: Medicare HMO | Source: Ambulatory Visit | Attending: Internal Medicine | Admitting: Internal Medicine

## 2021-04-26 ENCOUNTER — Encounter (HOSPITAL_COMMUNITY): Payer: Self-pay

## 2021-04-26 ENCOUNTER — Other Ambulatory Visit: Payer: Self-pay

## 2021-04-26 ENCOUNTER — Other Ambulatory Visit (HOSPITAL_COMMUNITY): Payer: Self-pay | Admitting: Internal Medicine

## 2021-04-26 DIAGNOSIS — E041 Nontoxic single thyroid nodule: Secondary | ICD-10-CM

## 2021-04-26 MED ORDER — LIDOCAINE HCL (PF) 2 % IJ SOLN
10.0000 mL | Freq: Once | INTRAMUSCULAR | Status: AC
  Administered 2021-04-26: 10 mL

## 2021-04-26 NOTE — Sedation Documentation (Signed)
PT tolerated thyroid biopsy and aspiration well today with NAD noted. PT verbalized understanding of discharge instructions and pt's daughter updated post procedure by ultrasound staff. Labs collected were sent at this time. PT ambulatory at discharge and given copy of discharge instructions and had no complaints at exit.

## 2021-04-27 LAB — CYTOLOGY - NON PAP

## 2021-05-10 DIAGNOSIS — Z6821 Body mass index (BMI) 21.0-21.9, adult: Secondary | ICD-10-CM | POA: Diagnosis not present

## 2021-05-10 DIAGNOSIS — I10 Essential (primary) hypertension: Secondary | ICD-10-CM | POA: Diagnosis not present

## 2021-05-10 DIAGNOSIS — K2100 Gastro-esophageal reflux disease with esophagitis, without bleeding: Secondary | ICD-10-CM | POA: Diagnosis not present

## 2021-05-10 DIAGNOSIS — E782 Mixed hyperlipidemia: Secondary | ICD-10-CM | POA: Diagnosis not present

## 2021-05-10 DIAGNOSIS — N1831 Chronic kidney disease, stage 3a: Secondary | ICD-10-CM | POA: Diagnosis not present

## 2021-05-10 DIAGNOSIS — M545 Low back pain, unspecified: Secondary | ICD-10-CM | POA: Diagnosis not present

## 2021-05-10 DIAGNOSIS — Z0000 Encounter for general adult medical examination without abnormal findings: Secondary | ICD-10-CM | POA: Diagnosis not present

## 2021-05-10 DIAGNOSIS — E049 Nontoxic goiter, unspecified: Secondary | ICD-10-CM | POA: Diagnosis not present

## 2021-05-11 DIAGNOSIS — M79674 Pain in right toe(s): Secondary | ICD-10-CM | POA: Diagnosis not present

## 2021-05-11 DIAGNOSIS — M79675 Pain in left toe(s): Secondary | ICD-10-CM | POA: Diagnosis not present

## 2021-05-11 DIAGNOSIS — I739 Peripheral vascular disease, unspecified: Secondary | ICD-10-CM | POA: Diagnosis not present

## 2021-05-11 DIAGNOSIS — M25774 Osteophyte, right foot: Secondary | ICD-10-CM | POA: Diagnosis not present

## 2021-05-11 DIAGNOSIS — M79672 Pain in left foot: Secondary | ICD-10-CM | POA: Diagnosis not present

## 2021-05-11 DIAGNOSIS — M79671 Pain in right foot: Secondary | ICD-10-CM | POA: Diagnosis not present

## 2021-05-11 DIAGNOSIS — L110 Acquired keratosis follicularis: Secondary | ICD-10-CM | POA: Diagnosis not present

## 2021-07-20 DIAGNOSIS — L110 Acquired keratosis follicularis: Secondary | ICD-10-CM | POA: Diagnosis not present

## 2021-07-20 DIAGNOSIS — M79672 Pain in left foot: Secondary | ICD-10-CM | POA: Diagnosis not present

## 2021-07-20 DIAGNOSIS — I739 Peripheral vascular disease, unspecified: Secondary | ICD-10-CM | POA: Diagnosis not present

## 2021-07-20 DIAGNOSIS — M79675 Pain in left toe(s): Secondary | ICD-10-CM | POA: Diagnosis not present

## 2021-07-20 DIAGNOSIS — M25774 Osteophyte, right foot: Secondary | ICD-10-CM | POA: Diagnosis not present

## 2021-07-20 DIAGNOSIS — M79674 Pain in right toe(s): Secondary | ICD-10-CM | POA: Diagnosis not present

## 2021-07-20 DIAGNOSIS — M79671 Pain in right foot: Secondary | ICD-10-CM | POA: Diagnosis not present

## 2021-08-07 DIAGNOSIS — Z6821 Body mass index (BMI) 21.0-21.9, adult: Secondary | ICD-10-CM | POA: Diagnosis not present

## 2021-08-07 DIAGNOSIS — H6121 Impacted cerumen, right ear: Secondary | ICD-10-CM | POA: Diagnosis not present

## 2021-08-07 DIAGNOSIS — N1831 Chronic kidney disease, stage 3a: Secondary | ICD-10-CM | POA: Diagnosis not present

## 2021-08-07 DIAGNOSIS — I10 Essential (primary) hypertension: Secondary | ICD-10-CM | POA: Diagnosis not present

## 2021-08-07 DIAGNOSIS — E049 Nontoxic goiter, unspecified: Secondary | ICD-10-CM | POA: Diagnosis not present

## 2021-08-07 DIAGNOSIS — E782 Mixed hyperlipidemia: Secondary | ICD-10-CM | POA: Diagnosis not present

## 2021-08-07 DIAGNOSIS — K2100 Gastro-esophageal reflux disease with esophagitis, without bleeding: Secondary | ICD-10-CM | POA: Diagnosis not present

## 2021-08-07 DIAGNOSIS — M545 Low back pain, unspecified: Secondary | ICD-10-CM | POA: Diagnosis not present

## 2021-11-16 DIAGNOSIS — K2100 Gastro-esophageal reflux disease with esophagitis, without bleeding: Secondary | ICD-10-CM | POA: Diagnosis not present

## 2021-11-16 DIAGNOSIS — Z6821 Body mass index (BMI) 21.0-21.9, adult: Secondary | ICD-10-CM | POA: Diagnosis not present

## 2021-11-16 DIAGNOSIS — E049 Nontoxic goiter, unspecified: Secondary | ICD-10-CM | POA: Diagnosis not present

## 2021-11-16 DIAGNOSIS — E782 Mixed hyperlipidemia: Secondary | ICD-10-CM | POA: Diagnosis not present

## 2021-11-16 DIAGNOSIS — N1831 Chronic kidney disease, stage 3a: Secondary | ICD-10-CM | POA: Diagnosis not present

## 2021-11-16 DIAGNOSIS — I10 Essential (primary) hypertension: Secondary | ICD-10-CM | POA: Diagnosis not present

## 2021-11-16 DIAGNOSIS — M545 Low back pain, unspecified: Secondary | ICD-10-CM | POA: Diagnosis not present

## 2021-11-16 DIAGNOSIS — E215 Disorder of parathyroid gland, unspecified: Secondary | ICD-10-CM | POA: Diagnosis not present

## 2021-11-16 DIAGNOSIS — H6121 Impacted cerumen, right ear: Secondary | ICD-10-CM | POA: Diagnosis not present

## 2022-02-21 DIAGNOSIS — Z0000 Encounter for general adult medical examination without abnormal findings: Secondary | ICD-10-CM | POA: Diagnosis not present

## 2022-02-21 DIAGNOSIS — M5459 Other low back pain: Secondary | ICD-10-CM | POA: Diagnosis not present

## 2022-02-21 DIAGNOSIS — N1831 Chronic kidney disease, stage 3a: Secondary | ICD-10-CM | POA: Diagnosis not present

## 2022-02-21 DIAGNOSIS — Z6821 Body mass index (BMI) 21.0-21.9, adult: Secondary | ICD-10-CM | POA: Diagnosis not present

## 2022-02-21 DIAGNOSIS — I10 Essential (primary) hypertension: Secondary | ICD-10-CM | POA: Diagnosis not present

## 2022-02-21 DIAGNOSIS — E782 Mixed hyperlipidemia: Secondary | ICD-10-CM | POA: Diagnosis not present

## 2022-02-21 DIAGNOSIS — K2100 Gastro-esophageal reflux disease with esophagitis, without bleeding: Secondary | ICD-10-CM | POA: Diagnosis not present

## 2022-06-06 DIAGNOSIS — Z6822 Body mass index (BMI) 22.0-22.9, adult: Secondary | ICD-10-CM | POA: Diagnosis not present

## 2022-06-06 DIAGNOSIS — I10 Essential (primary) hypertension: Secondary | ICD-10-CM | POA: Diagnosis not present

## 2022-06-06 DIAGNOSIS — N1831 Chronic kidney disease, stage 3a: Secondary | ICD-10-CM | POA: Diagnosis not present

## 2022-06-06 DIAGNOSIS — E782 Mixed hyperlipidemia: Secondary | ICD-10-CM | POA: Diagnosis not present

## 2022-06-06 DIAGNOSIS — Z0000 Encounter for general adult medical examination without abnormal findings: Secondary | ICD-10-CM | POA: Diagnosis not present

## 2022-06-06 DIAGNOSIS — M5459 Other low back pain: Secondary | ICD-10-CM | POA: Diagnosis not present

## 2022-06-06 DIAGNOSIS — R69 Illness, unspecified: Secondary | ICD-10-CM | POA: Diagnosis not present

## 2022-06-06 DIAGNOSIS — F0390 Unspecified dementia without behavioral disturbance: Secondary | ICD-10-CM | POA: Diagnosis not present

## 2022-06-06 DIAGNOSIS — K2100 Gastro-esophageal reflux disease with esophagitis, without bleeding: Secondary | ICD-10-CM | POA: Diagnosis not present

## 2022-06-18 DIAGNOSIS — F0390 Unspecified dementia without behavioral disturbance: Secondary | ICD-10-CM | POA: Diagnosis not present

## 2022-06-18 DIAGNOSIS — R69 Illness, unspecified: Secondary | ICD-10-CM | POA: Diagnosis not present

## 2022-07-30 IMAGING — US US FNA BIOPSY THYROID 1ST LESION
1 series · 13 of 21 positions shown · non-contrast
Comparison: US 04/13/21

MEDICATIONS:
6 cc 1% lidocaine

COMPLICATIONS:
None immediate.

INDICATION: Right mid thyroid cyst

6.1 cm
EXAM:
ULTRASOUND GUIDED FINE NEEDLE ASPIRATION OF INDETERMINATE THYROID
NODULE
TECHNIQUE: Informed written consent was obtained from the patient after a
discussion of the risks, benefits and alternatives to treatment.
Questions regarding the procedure were encouraged and answered. A
timeout was performed prior to the initiation of the procedure.

[Series 1: us fna biopsy thyroid 1st lesion · 0.09mm/px · 21 acquisitions, 13 frames shown]
[im 1/21]
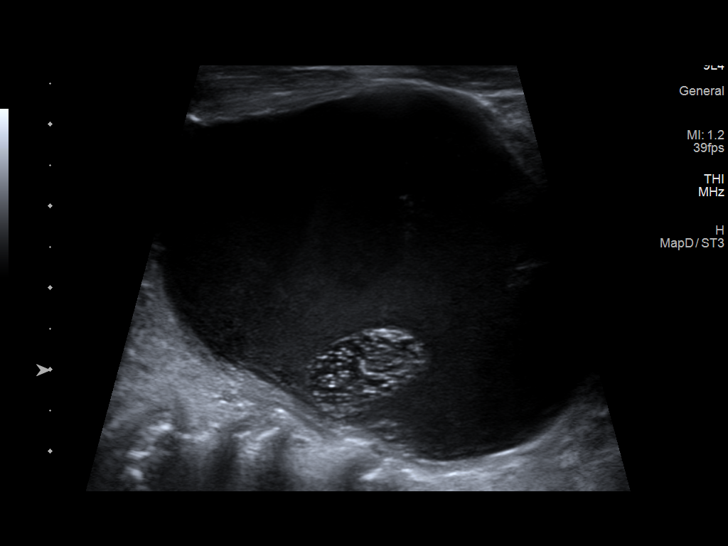
[im 3/21]
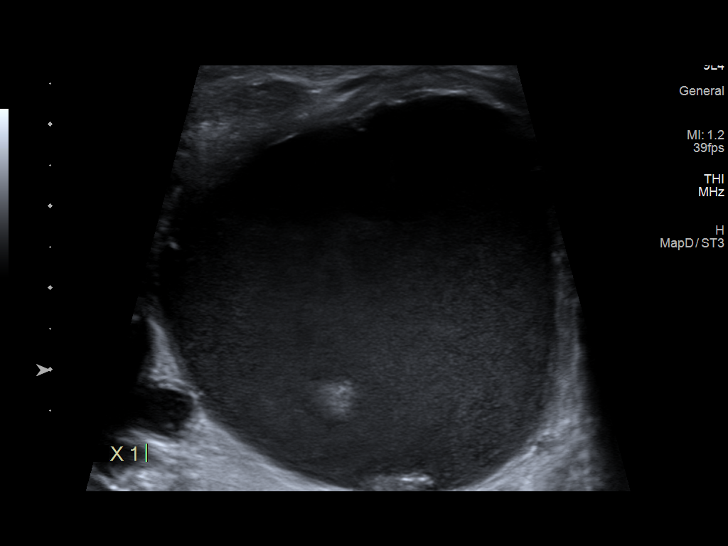
[im 5/21]
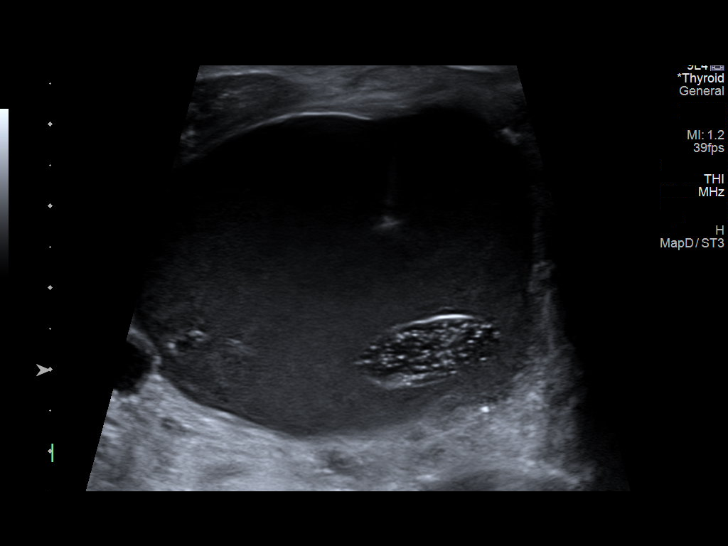
[im 6/21]
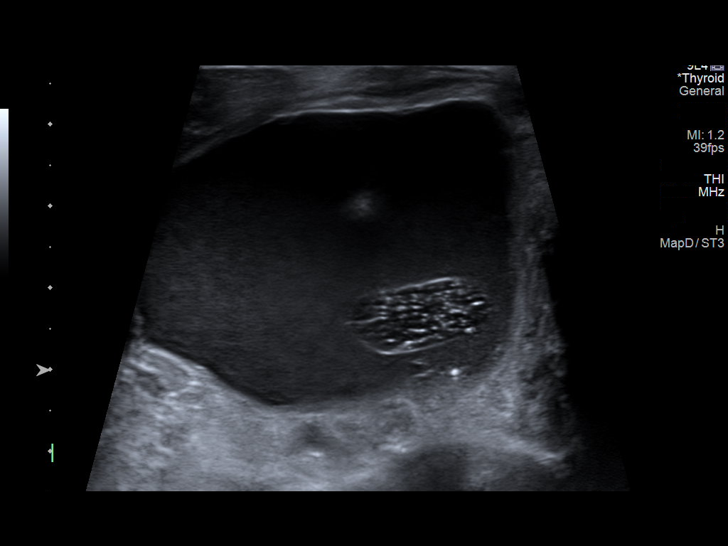
[im 8/21]
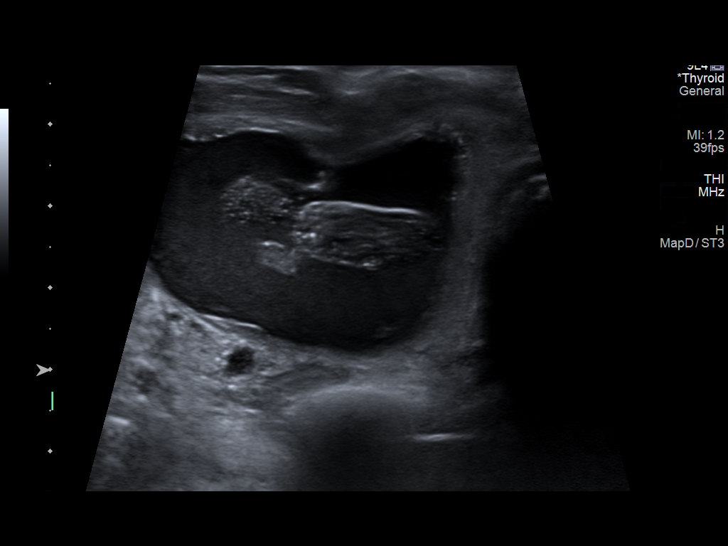
[im 9/21]
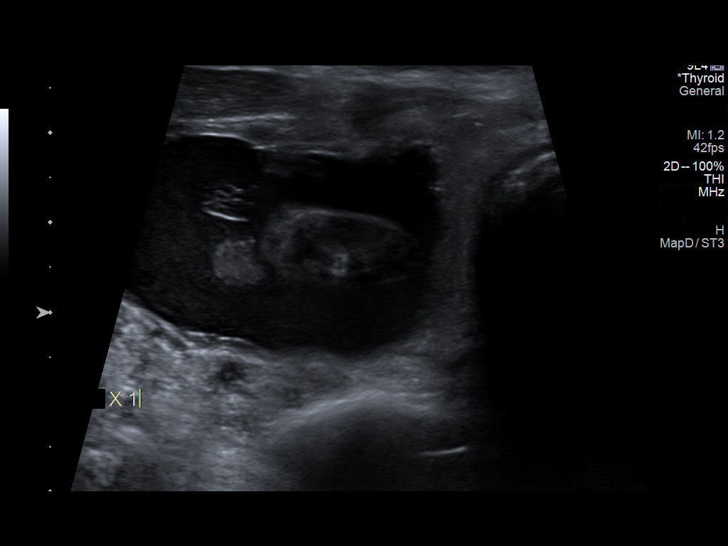
[im 11/21]
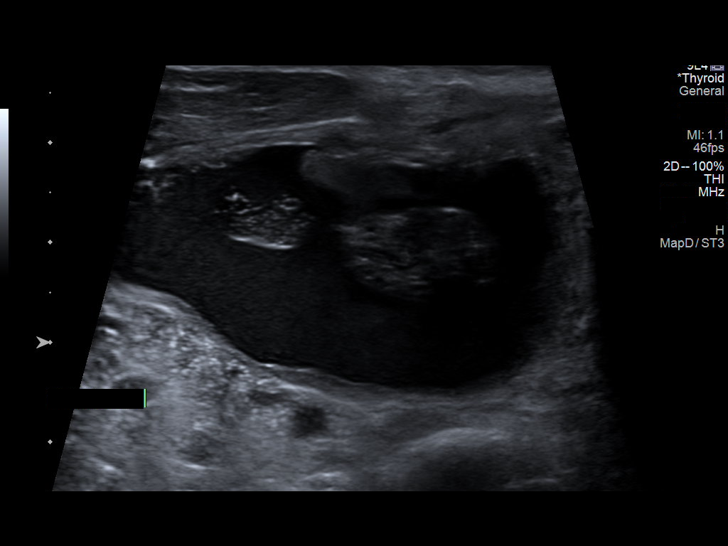
[im 13/21]
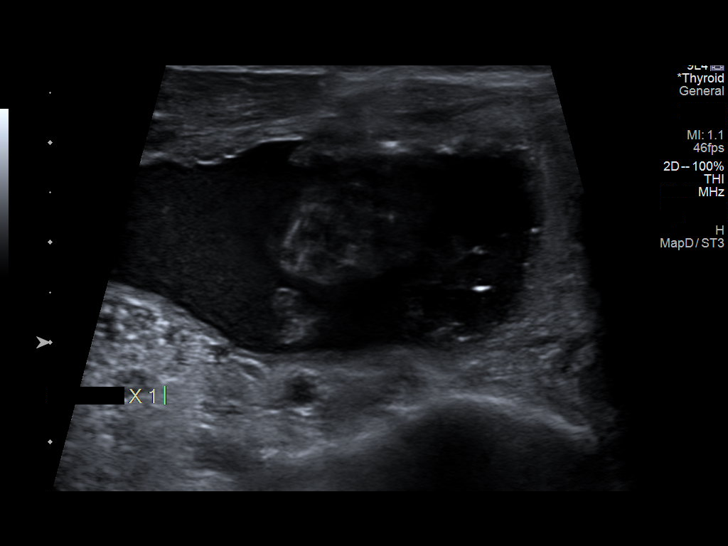
[im 14/21]
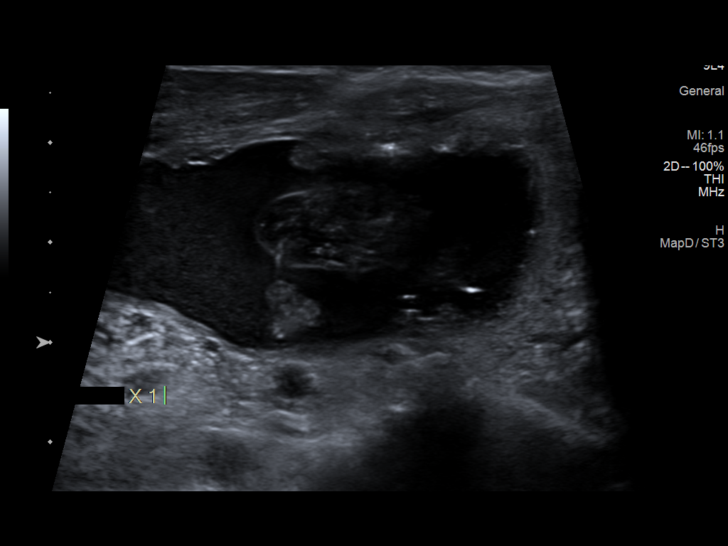
[im 16/21]
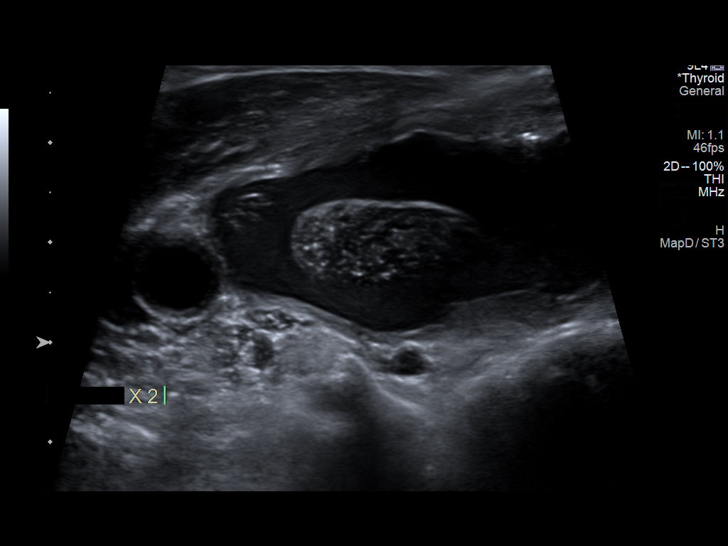
[im 17/21]
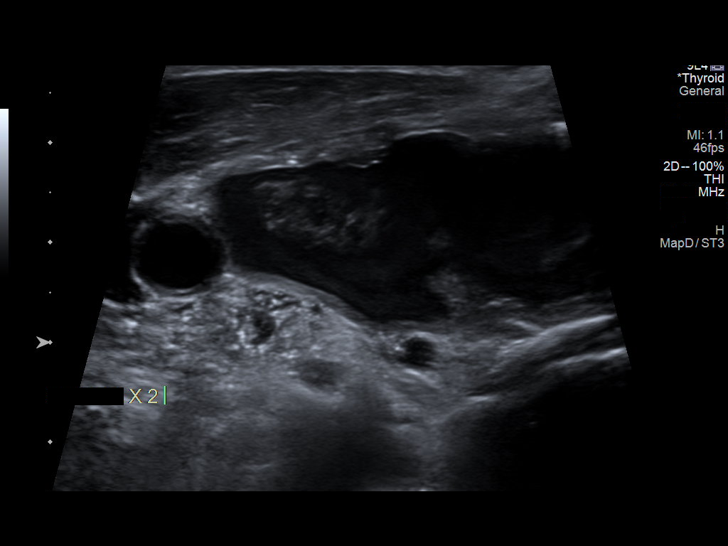
[im 19/21]
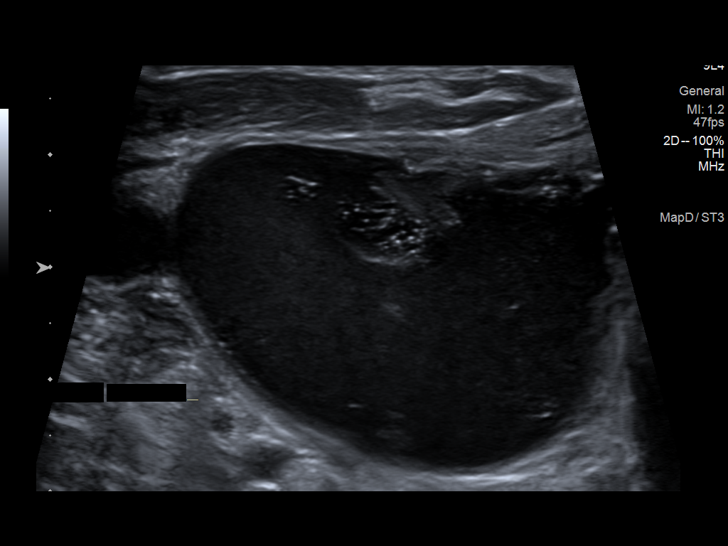
[im 21/21]
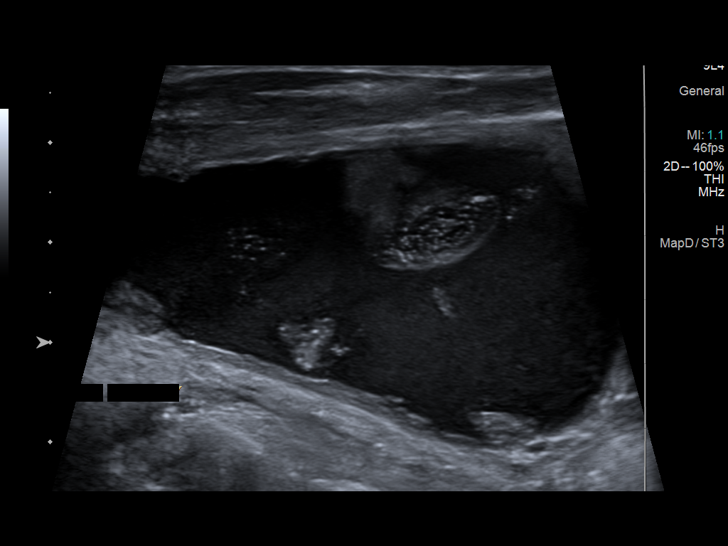

[13 of 21 positions shown; findings below may reference images not displayed]

Pre-procedural ultrasound scanning demonstrated unchanged size and
appearance of the indeterminate cyst/nodule within the right thyroid

The procedure was planned. The neck was prepped in the usual sterile
fashion, and a sterile drape was applied covering the operative
field. A timeout was performed prior to the initiation of the
procedure. Local anesthesia was provided with 1% lidocaine.

Under direct ultrasound guidance, 4 FNA aspirates were performed of
the right thyroid cyst.

35 cc cloudy brown colloid material obtained using 22 G needle.

2 samples were obtained of solid component within cyst for FNA using
25 G needle.

2 samples were obtained from solid component within cyst for AFIRMA
using 25 G needle

Multiple ultrasound images were saved for procedural documentation
purposes. The samples were prepared and submitted to pathology.

Limited post procedural scanning was negative for hematoma or
additional complication. Dressings were placed. The patient
tolerated the above procedures procedure well without immediate
postprocedural complication.
FINDINGS: Nodule reference number based on prior diagnostic ultrasound: 1

Maximum size: 6.1 cm

Location: Right; Mid

ACR TI-RADS risk category:

Reason for biopsy: meets other recommendations: Although
sonographically benign- cyst was bothersome to patient secondary
size and location.

Ultrasound imaging confirms appropriate placement of the needles
within the thyroid nodule.
IMPRESSION: Technically successful ultrasound guided fine needle aspiration of
cyst/nodule of right mid lobe thyroid

Read by

Mateus Tchiweyenge Kyaku

## 2022-09-05 DIAGNOSIS — R69 Illness, unspecified: Secondary | ICD-10-CM | POA: Diagnosis not present

## 2022-09-05 DIAGNOSIS — M5459 Other low back pain: Secondary | ICD-10-CM | POA: Diagnosis not present

## 2022-09-05 DIAGNOSIS — E782 Mixed hyperlipidemia: Secondary | ICD-10-CM | POA: Diagnosis not present

## 2022-09-05 DIAGNOSIS — Z6821 Body mass index (BMI) 21.0-21.9, adult: Secondary | ICD-10-CM | POA: Diagnosis not present

## 2022-09-05 DIAGNOSIS — J14 Pneumonia due to Hemophilus influenzae: Secondary | ICD-10-CM | POA: Diagnosis not present

## 2022-09-05 DIAGNOSIS — N1831 Chronic kidney disease, stage 3a: Secondary | ICD-10-CM | POA: Diagnosis not present

## 2022-09-05 DIAGNOSIS — I10 Essential (primary) hypertension: Secondary | ICD-10-CM | POA: Diagnosis not present

## 2022-09-05 DIAGNOSIS — K2100 Gastro-esophageal reflux disease with esophagitis, without bleeding: Secondary | ICD-10-CM | POA: Diagnosis not present

## 2022-11-27 DIAGNOSIS — M25774 Osteophyte, right foot: Secondary | ICD-10-CM | POA: Diagnosis not present

## 2022-11-27 DIAGNOSIS — L110 Acquired keratosis follicularis: Secondary | ICD-10-CM | POA: Diagnosis not present

## 2022-11-27 DIAGNOSIS — M79672 Pain in left foot: Secondary | ICD-10-CM | POA: Diagnosis not present

## 2022-11-27 DIAGNOSIS — I739 Peripheral vascular disease, unspecified: Secondary | ICD-10-CM | POA: Diagnosis not present

## 2022-11-27 DIAGNOSIS — M79671 Pain in right foot: Secondary | ICD-10-CM | POA: Diagnosis not present

## 2022-11-27 DIAGNOSIS — M79675 Pain in left toe(s): Secondary | ICD-10-CM | POA: Diagnosis not present

## 2022-11-27 DIAGNOSIS — M79674 Pain in right toe(s): Secondary | ICD-10-CM | POA: Diagnosis not present

## 2022-12-05 DIAGNOSIS — R69 Illness, unspecified: Secondary | ICD-10-CM | POA: Diagnosis not present

## 2022-12-05 DIAGNOSIS — K2100 Gastro-esophageal reflux disease with esophagitis, without bleeding: Secondary | ICD-10-CM | POA: Diagnosis not present

## 2022-12-05 DIAGNOSIS — N1831 Chronic kidney disease, stage 3a: Secondary | ICD-10-CM | POA: Diagnosis not present

## 2022-12-05 DIAGNOSIS — E782 Mixed hyperlipidemia: Secondary | ICD-10-CM | POA: Diagnosis not present

## 2022-12-05 DIAGNOSIS — J14 Pneumonia due to Hemophilus influenzae: Secondary | ICD-10-CM | POA: Diagnosis not present

## 2022-12-05 DIAGNOSIS — M5459 Other low back pain: Secondary | ICD-10-CM | POA: Diagnosis not present

## 2022-12-05 DIAGNOSIS — Z6822 Body mass index (BMI) 22.0-22.9, adult: Secondary | ICD-10-CM | POA: Diagnosis not present

## 2022-12-05 DIAGNOSIS — Z0000 Encounter for general adult medical examination without abnormal findings: Secondary | ICD-10-CM | POA: Diagnosis not present

## 2022-12-05 DIAGNOSIS — I10 Essential (primary) hypertension: Secondary | ICD-10-CM | POA: Diagnosis not present

## 2022-12-05 DIAGNOSIS — F0390 Unspecified dementia without behavioral disturbance: Secondary | ICD-10-CM | POA: Diagnosis not present

## 2023-01-08 DIAGNOSIS — I739 Peripheral vascular disease, unspecified: Secondary | ICD-10-CM | POA: Diagnosis not present

## 2023-02-12 DIAGNOSIS — M79671 Pain in right foot: Secondary | ICD-10-CM | POA: Diagnosis not present

## 2023-02-12 DIAGNOSIS — M79672 Pain in left foot: Secondary | ICD-10-CM | POA: Diagnosis not present

## 2023-02-12 DIAGNOSIS — M25774 Osteophyte, right foot: Secondary | ICD-10-CM | POA: Diagnosis not present

## 2023-02-12 DIAGNOSIS — M79675 Pain in left toe(s): Secondary | ICD-10-CM | POA: Diagnosis not present

## 2023-02-12 DIAGNOSIS — I739 Peripheral vascular disease, unspecified: Secondary | ICD-10-CM | POA: Diagnosis not present

## 2023-02-12 DIAGNOSIS — M79674 Pain in right toe(s): Secondary | ICD-10-CM | POA: Diagnosis not present

## 2023-02-12 DIAGNOSIS — L110 Acquired keratosis follicularis: Secondary | ICD-10-CM | POA: Diagnosis not present

## 2023-03-06 DIAGNOSIS — Z6822 Body mass index (BMI) 22.0-22.9, adult: Secondary | ICD-10-CM | POA: Diagnosis not present

## 2023-03-06 DIAGNOSIS — E782 Mixed hyperlipidemia: Secondary | ICD-10-CM | POA: Diagnosis not present

## 2023-03-06 DIAGNOSIS — M5459 Other low back pain: Secondary | ICD-10-CM | POA: Diagnosis not present

## 2023-03-06 DIAGNOSIS — Z0000 Encounter for general adult medical examination without abnormal findings: Secondary | ICD-10-CM | POA: Diagnosis not present

## 2023-03-06 DIAGNOSIS — N184 Chronic kidney disease, stage 4 (severe): Secondary | ICD-10-CM | POA: Diagnosis not present

## 2023-03-06 DIAGNOSIS — F0390 Unspecified dementia without behavioral disturbance: Secondary | ICD-10-CM | POA: Diagnosis not present

## 2023-03-06 DIAGNOSIS — E8889 Other specified metabolic disorders: Secondary | ICD-10-CM | POA: Diagnosis not present

## 2023-03-06 DIAGNOSIS — I10 Essential (primary) hypertension: Secondary | ICD-10-CM | POA: Diagnosis not present

## 2023-03-06 DIAGNOSIS — K2100 Gastro-esophageal reflux disease with esophagitis, without bleeding: Secondary | ICD-10-CM | POA: Diagnosis not present

## 2023-03-14 DIAGNOSIS — M79674 Pain in right toe(s): Secondary | ICD-10-CM | POA: Diagnosis not present

## 2023-03-14 DIAGNOSIS — M2011 Hallux valgus (acquired), right foot: Secondary | ICD-10-CM | POA: Diagnosis not present

## 2023-03-14 DIAGNOSIS — M79671 Pain in right foot: Secondary | ICD-10-CM | POA: Diagnosis not present

## 2023-05-13 DIAGNOSIS — M79671 Pain in right foot: Secondary | ICD-10-CM | POA: Diagnosis not present

## 2023-05-13 DIAGNOSIS — M79672 Pain in left foot: Secondary | ICD-10-CM | POA: Diagnosis not present

## 2023-05-13 DIAGNOSIS — M79675 Pain in left toe(s): Secondary | ICD-10-CM | POA: Diagnosis not present

## 2023-05-13 DIAGNOSIS — I739 Peripheral vascular disease, unspecified: Secondary | ICD-10-CM | POA: Diagnosis not present

## 2023-05-13 DIAGNOSIS — M79674 Pain in right toe(s): Secondary | ICD-10-CM | POA: Diagnosis not present

## 2023-05-13 DIAGNOSIS — M25774 Osteophyte, right foot: Secondary | ICD-10-CM | POA: Diagnosis not present

## 2023-05-13 DIAGNOSIS — L110 Acquired keratosis follicularis: Secondary | ICD-10-CM | POA: Diagnosis not present

## 2023-07-03 DIAGNOSIS — K2100 Gastro-esophageal reflux disease with esophagitis, without bleeding: Secondary | ICD-10-CM | POA: Diagnosis not present

## 2023-07-03 DIAGNOSIS — Z6822 Body mass index (BMI) 22.0-22.9, adult: Secondary | ICD-10-CM | POA: Diagnosis not present

## 2023-07-03 DIAGNOSIS — I10 Essential (primary) hypertension: Secondary | ICD-10-CM | POA: Diagnosis not present

## 2023-07-03 DIAGNOSIS — N184 Chronic kidney disease, stage 4 (severe): Secondary | ICD-10-CM | POA: Diagnosis not present

## 2023-07-03 DIAGNOSIS — E8889 Other specified metabolic disorders: Secondary | ICD-10-CM | POA: Diagnosis not present

## 2023-07-03 DIAGNOSIS — F0390 Unspecified dementia without behavioral disturbance: Secondary | ICD-10-CM | POA: Diagnosis not present

## 2023-07-03 DIAGNOSIS — E782 Mixed hyperlipidemia: Secondary | ICD-10-CM | POA: Diagnosis not present

## 2023-07-03 DIAGNOSIS — Z0000 Encounter for general adult medical examination without abnormal findings: Secondary | ICD-10-CM | POA: Diagnosis not present

## 2023-07-03 DIAGNOSIS — M5459 Other low back pain: Secondary | ICD-10-CM | POA: Diagnosis not present

## 2023-08-06 DIAGNOSIS — I739 Peripheral vascular disease, unspecified: Secondary | ICD-10-CM | POA: Diagnosis not present

## 2023-08-06 DIAGNOSIS — M79675 Pain in left toe(s): Secondary | ICD-10-CM | POA: Diagnosis not present

## 2023-08-06 DIAGNOSIS — M79671 Pain in right foot: Secondary | ICD-10-CM | POA: Diagnosis not present

## 2023-08-06 DIAGNOSIS — M79672 Pain in left foot: Secondary | ICD-10-CM | POA: Diagnosis not present

## 2023-08-06 DIAGNOSIS — M79674 Pain in right toe(s): Secondary | ICD-10-CM | POA: Diagnosis not present

## 2023-08-06 DIAGNOSIS — L110 Acquired keratosis follicularis: Secondary | ICD-10-CM | POA: Diagnosis not present

## 2023-08-06 DIAGNOSIS — M25774 Osteophyte, right foot: Secondary | ICD-10-CM | POA: Diagnosis not present

## 2023-08-13 DIAGNOSIS — F0390 Unspecified dementia without behavioral disturbance: Secondary | ICD-10-CM | POA: Diagnosis not present

## 2023-08-13 DIAGNOSIS — K2100 Gastro-esophageal reflux disease with esophagitis, without bleeding: Secondary | ICD-10-CM | POA: Diagnosis not present

## 2023-08-13 DIAGNOSIS — M5459 Other low back pain: Secondary | ICD-10-CM | POA: Diagnosis not present

## 2023-08-13 DIAGNOSIS — E782 Mixed hyperlipidemia: Secondary | ICD-10-CM | POA: Diagnosis not present

## 2023-08-13 DIAGNOSIS — I10 Essential (primary) hypertension: Secondary | ICD-10-CM | POA: Diagnosis not present

## 2023-10-17 DIAGNOSIS — K2100 Gastro-esophageal reflux disease with esophagitis, without bleeding: Secondary | ICD-10-CM | POA: Diagnosis not present

## 2023-10-17 DIAGNOSIS — E8889 Other specified metabolic disorders: Secondary | ICD-10-CM | POA: Diagnosis not present

## 2023-10-17 DIAGNOSIS — F0390 Unspecified dementia without behavioral disturbance: Secondary | ICD-10-CM | POA: Diagnosis not present

## 2023-10-17 DIAGNOSIS — I10 Essential (primary) hypertension: Secondary | ICD-10-CM | POA: Diagnosis not present

## 2023-10-17 DIAGNOSIS — Z6821 Body mass index (BMI) 21.0-21.9, adult: Secondary | ICD-10-CM | POA: Diagnosis not present

## 2023-10-17 DIAGNOSIS — Z0000 Encounter for general adult medical examination without abnormal findings: Secondary | ICD-10-CM | POA: Diagnosis not present

## 2023-10-17 DIAGNOSIS — N1832 Chronic kidney disease, stage 3b: Secondary | ICD-10-CM | POA: Diagnosis not present

## 2023-10-17 DIAGNOSIS — E041 Nontoxic single thyroid nodule: Secondary | ICD-10-CM | POA: Diagnosis not present

## 2023-10-17 DIAGNOSIS — M5459 Other low back pain: Secondary | ICD-10-CM | POA: Diagnosis not present

## 2023-10-17 DIAGNOSIS — E782 Mixed hyperlipidemia: Secondary | ICD-10-CM | POA: Diagnosis not present

## 2023-10-31 DIAGNOSIS — M79675 Pain in left toe(s): Secondary | ICD-10-CM | POA: Diagnosis not present

## 2023-10-31 DIAGNOSIS — I739 Peripheral vascular disease, unspecified: Secondary | ICD-10-CM | POA: Diagnosis not present

## 2023-10-31 DIAGNOSIS — M79674 Pain in right toe(s): Secondary | ICD-10-CM | POA: Diagnosis not present

## 2023-10-31 DIAGNOSIS — M25774 Osteophyte, right foot: Secondary | ICD-10-CM | POA: Diagnosis not present

## 2023-10-31 DIAGNOSIS — M79672 Pain in left foot: Secondary | ICD-10-CM | POA: Diagnosis not present

## 2023-10-31 DIAGNOSIS — L110 Acquired keratosis follicularis: Secondary | ICD-10-CM | POA: Diagnosis not present

## 2023-10-31 DIAGNOSIS — M79671 Pain in right foot: Secondary | ICD-10-CM | POA: Diagnosis not present

## 2023-12-02 DIAGNOSIS — H524 Presbyopia: Secondary | ICD-10-CM | POA: Diagnosis not present

## 2023-12-02 DIAGNOSIS — H353130 Nonexudative age-related macular degeneration, bilateral, stage unspecified: Secondary | ICD-10-CM | POA: Diagnosis not present

## 2024-01-14 DIAGNOSIS — M79675 Pain in left toe(s): Secondary | ICD-10-CM | POA: Diagnosis not present

## 2024-01-14 DIAGNOSIS — I739 Peripheral vascular disease, unspecified: Secondary | ICD-10-CM | POA: Diagnosis not present

## 2024-01-14 DIAGNOSIS — M79671 Pain in right foot: Secondary | ICD-10-CM | POA: Diagnosis not present

## 2024-01-14 DIAGNOSIS — L110 Acquired keratosis follicularis: Secondary | ICD-10-CM | POA: Diagnosis not present

## 2024-01-14 DIAGNOSIS — Z780 Asymptomatic menopausal state: Secondary | ICD-10-CM | POA: Diagnosis not present

## 2024-01-14 DIAGNOSIS — M810 Age-related osteoporosis without current pathological fracture: Secondary | ICD-10-CM | POA: Diagnosis not present

## 2024-01-14 DIAGNOSIS — M25774 Osteophyte, right foot: Secondary | ICD-10-CM | POA: Diagnosis not present

## 2024-01-14 DIAGNOSIS — M79674 Pain in right toe(s): Secondary | ICD-10-CM | POA: Diagnosis not present

## 2024-01-14 DIAGNOSIS — M79672 Pain in left foot: Secondary | ICD-10-CM | POA: Diagnosis not present

## 2024-01-16 DIAGNOSIS — Z0000 Encounter for general adult medical examination without abnormal findings: Secondary | ICD-10-CM | POA: Diagnosis not present

## 2024-01-16 DIAGNOSIS — I10 Essential (primary) hypertension: Secondary | ICD-10-CM | POA: Diagnosis not present

## 2024-01-16 DIAGNOSIS — E8889 Other specified metabolic disorders: Secondary | ICD-10-CM | POA: Diagnosis not present

## 2024-01-16 DIAGNOSIS — M5459 Other low back pain: Secondary | ICD-10-CM | POA: Diagnosis not present

## 2024-01-16 DIAGNOSIS — E041 Nontoxic single thyroid nodule: Secondary | ICD-10-CM | POA: Diagnosis not present

## 2024-01-16 DIAGNOSIS — N1832 Chronic kidney disease, stage 3b: Secondary | ICD-10-CM | POA: Diagnosis not present

## 2024-01-16 DIAGNOSIS — M818 Other osteoporosis without current pathological fracture: Secondary | ICD-10-CM | POA: Diagnosis not present

## 2024-01-16 DIAGNOSIS — Z6821 Body mass index (BMI) 21.0-21.9, adult: Secondary | ICD-10-CM | POA: Diagnosis not present

## 2024-01-16 DIAGNOSIS — E782 Mixed hyperlipidemia: Secondary | ICD-10-CM | POA: Diagnosis not present

## 2024-01-16 DIAGNOSIS — F0390 Unspecified dementia without behavioral disturbance: Secondary | ICD-10-CM | POA: Diagnosis not present

## 2024-01-16 DIAGNOSIS — K2100 Gastro-esophageal reflux disease with esophagitis, without bleeding: Secondary | ICD-10-CM | POA: Diagnosis not present

## 2024-01-22 DIAGNOSIS — N182 Chronic kidney disease, stage 2 (mild): Secondary | ICD-10-CM | POA: Diagnosis not present

## 2024-01-22 DIAGNOSIS — I10 Essential (primary) hypertension: Secondary | ICD-10-CM | POA: Diagnosis not present

## 2024-01-27 DIAGNOSIS — M810 Age-related osteoporosis without current pathological fracture: Secondary | ICD-10-CM | POA: Diagnosis not present

## 2024-02-04 DIAGNOSIS — B0052 Herpesviral keratitis: Secondary | ICD-10-CM | POA: Diagnosis not present

## 2024-02-05 DIAGNOSIS — B0052 Herpesviral keratitis: Secondary | ICD-10-CM | POA: Diagnosis not present

## 2024-02-05 DIAGNOSIS — H2513 Age-related nuclear cataract, bilateral: Secondary | ICD-10-CM | POA: Diagnosis not present

## 2024-02-14 DIAGNOSIS — H2513 Age-related nuclear cataract, bilateral: Secondary | ICD-10-CM | POA: Diagnosis not present

## 2024-02-14 DIAGNOSIS — B0052 Herpesviral keratitis: Secondary | ICD-10-CM | POA: Diagnosis not present

## 2024-02-19 DIAGNOSIS — N182 Chronic kidney disease, stage 2 (mild): Secondary | ICD-10-CM | POA: Diagnosis not present

## 2024-02-19 DIAGNOSIS — I10 Essential (primary) hypertension: Secondary | ICD-10-CM | POA: Diagnosis not present

## 2024-03-18 DIAGNOSIS — H2513 Age-related nuclear cataract, bilateral: Secondary | ICD-10-CM | POA: Diagnosis not present

## 2024-03-18 DIAGNOSIS — B0052 Herpesviral keratitis: Secondary | ICD-10-CM | POA: Diagnosis not present

## 2024-03-19 DIAGNOSIS — I10 Essential (primary) hypertension: Secondary | ICD-10-CM | POA: Diagnosis not present

## 2024-03-19 DIAGNOSIS — N182 Chronic kidney disease, stage 2 (mild): Secondary | ICD-10-CM | POA: Diagnosis not present

## 2024-04-16 DIAGNOSIS — E8889 Other specified metabolic disorders: Secondary | ICD-10-CM | POA: Diagnosis not present

## 2024-04-16 DIAGNOSIS — F0390 Unspecified dementia without behavioral disturbance: Secondary | ICD-10-CM | POA: Diagnosis not present

## 2024-04-16 DIAGNOSIS — E782 Mixed hyperlipidemia: Secondary | ICD-10-CM | POA: Diagnosis not present

## 2024-04-16 DIAGNOSIS — M818 Other osteoporosis without current pathological fracture: Secondary | ICD-10-CM | POA: Diagnosis not present

## 2024-04-16 DIAGNOSIS — E041 Nontoxic single thyroid nodule: Secondary | ICD-10-CM | POA: Diagnosis not present

## 2024-04-16 DIAGNOSIS — K2100 Gastro-esophageal reflux disease with esophagitis, without bleeding: Secondary | ICD-10-CM | POA: Diagnosis not present

## 2024-04-16 DIAGNOSIS — Z6820 Body mass index (BMI) 20.0-20.9, adult: Secondary | ICD-10-CM | POA: Diagnosis not present

## 2024-04-16 DIAGNOSIS — N184 Chronic kidney disease, stage 4 (severe): Secondary | ICD-10-CM | POA: Diagnosis not present

## 2024-04-16 DIAGNOSIS — M5459 Other low back pain: Secondary | ICD-10-CM | POA: Diagnosis not present

## 2024-04-16 DIAGNOSIS — I10 Essential (primary) hypertension: Secondary | ICD-10-CM | POA: Diagnosis not present

## 2024-04-21 DIAGNOSIS — M79674 Pain in right toe(s): Secondary | ICD-10-CM | POA: Diagnosis not present

## 2024-04-21 DIAGNOSIS — M79672 Pain in left foot: Secondary | ICD-10-CM | POA: Diagnosis not present

## 2024-04-21 DIAGNOSIS — M25774 Osteophyte, right foot: Secondary | ICD-10-CM | POA: Diagnosis not present

## 2024-04-21 DIAGNOSIS — M79671 Pain in right foot: Secondary | ICD-10-CM | POA: Diagnosis not present

## 2024-04-21 DIAGNOSIS — M79675 Pain in left toe(s): Secondary | ICD-10-CM | POA: Diagnosis not present

## 2024-04-21 DIAGNOSIS — I739 Peripheral vascular disease, unspecified: Secondary | ICD-10-CM | POA: Diagnosis not present

## 2024-04-21 DIAGNOSIS — L110 Acquired keratosis follicularis: Secondary | ICD-10-CM | POA: Diagnosis not present

## 2024-07-02 DIAGNOSIS — I739 Peripheral vascular disease, unspecified: Secondary | ICD-10-CM | POA: Diagnosis not present

## 2024-07-02 DIAGNOSIS — M79674 Pain in right toe(s): Secondary | ICD-10-CM | POA: Diagnosis not present

## 2024-07-02 DIAGNOSIS — M79675 Pain in left toe(s): Secondary | ICD-10-CM | POA: Diagnosis not present

## 2024-07-02 DIAGNOSIS — L110 Acquired keratosis follicularis: Secondary | ICD-10-CM | POA: Diagnosis not present

## 2024-07-02 DIAGNOSIS — M79671 Pain in right foot: Secondary | ICD-10-CM | POA: Diagnosis not present

## 2024-07-02 DIAGNOSIS — M79672 Pain in left foot: Secondary | ICD-10-CM | POA: Diagnosis not present

## 2024-07-02 DIAGNOSIS — M25774 Osteophyte, right foot: Secondary | ICD-10-CM | POA: Diagnosis not present

## 2024-07-15 DIAGNOSIS — M5459 Other low back pain: Secondary | ICD-10-CM | POA: Diagnosis not present

## 2024-07-15 DIAGNOSIS — E782 Mixed hyperlipidemia: Secondary | ICD-10-CM | POA: Diagnosis not present

## 2024-07-15 DIAGNOSIS — F0390 Unspecified dementia without behavioral disturbance: Secondary | ICD-10-CM | POA: Diagnosis not present

## 2024-07-15 DIAGNOSIS — N184 Chronic kidney disease, stage 4 (severe): Secondary | ICD-10-CM | POA: Diagnosis not present

## 2024-07-15 DIAGNOSIS — M818 Other osteoporosis without current pathological fracture: Secondary | ICD-10-CM | POA: Diagnosis not present

## 2024-07-15 DIAGNOSIS — I10 Essential (primary) hypertension: Secondary | ICD-10-CM | POA: Diagnosis not present

## 2024-07-15 DIAGNOSIS — Z681 Body mass index (BMI) 19 or less, adult: Secondary | ICD-10-CM | POA: Diagnosis not present

## 2024-07-15 DIAGNOSIS — E8889 Other specified metabolic disorders: Secondary | ICD-10-CM | POA: Diagnosis not present

## 2024-07-15 DIAGNOSIS — K2100 Gastro-esophageal reflux disease with esophagitis, without bleeding: Secondary | ICD-10-CM | POA: Diagnosis not present

## 2024-07-15 DIAGNOSIS — E041 Nontoxic single thyroid nodule: Secondary | ICD-10-CM | POA: Diagnosis not present

## 2024-07-29 DIAGNOSIS — I10 Essential (primary) hypertension: Secondary | ICD-10-CM | POA: Diagnosis not present

## 2024-07-29 DIAGNOSIS — N182 Chronic kidney disease, stage 2 (mild): Secondary | ICD-10-CM | POA: Diagnosis not present

## 2024-09-10 DIAGNOSIS — M79675 Pain in left toe(s): Secondary | ICD-10-CM | POA: Diagnosis not present

## 2024-09-10 DIAGNOSIS — M79672 Pain in left foot: Secondary | ICD-10-CM | POA: Diagnosis not present

## 2024-09-10 DIAGNOSIS — M79671 Pain in right foot: Secondary | ICD-10-CM | POA: Diagnosis not present

## 2024-09-10 DIAGNOSIS — M25774 Osteophyte, right foot: Secondary | ICD-10-CM | POA: Diagnosis not present

## 2024-09-10 DIAGNOSIS — M79674 Pain in right toe(s): Secondary | ICD-10-CM | POA: Diagnosis not present

## 2024-09-10 DIAGNOSIS — L110 Acquired keratosis follicularis: Secondary | ICD-10-CM | POA: Diagnosis not present

## 2024-09-10 DIAGNOSIS — I739 Peripheral vascular disease, unspecified: Secondary | ICD-10-CM | POA: Diagnosis not present
